# Patient Record
Sex: Female | Born: 1945 | Race: White | Hispanic: No | Marital: Married | Smoking: Never smoker
Health system: Southern US, Community
[De-identification: ages and names within clinical notes are randomized; demographics above are authoritative.]

## PROBLEM LIST (undated history)

## (undated) DIAGNOSIS — C50919 Malignant neoplasm of unspecified site of unspecified female breast: Secondary | ICD-10-CM

## (undated) DIAGNOSIS — E039 Hypothyroidism, unspecified: Secondary | ICD-10-CM

## (undated) DIAGNOSIS — I10 Essential (primary) hypertension: Secondary | ICD-10-CM

## (undated) HISTORY — PX: TUBAL LIGATION: SHX77

## (undated) HISTORY — DX: Malignant neoplasm of unspecified site of unspecified female breast: C50.919

## (undated) HISTORY — PX: COLONOSCOPY: SHX174

## (undated) HISTORY — PX: ABDOMINAL HYSTERECTOMY: SHX81

---

## 1991-09-11 HISTORY — PX: BREAST SURGERY: SHX581

## 1998-03-28 ENCOUNTER — Other Ambulatory Visit: Admission: RE | Admit: 1998-03-28 | Discharge: 1998-03-28 | Payer: Self-pay | Admitting: *Deleted

## 1998-08-11 ENCOUNTER — Encounter: Admission: RE | Admit: 1998-08-11 | Discharge: 1998-11-09 | Payer: Self-pay | Admitting: Oncology

## 1999-03-29 ENCOUNTER — Other Ambulatory Visit: Admission: RE | Admit: 1999-03-29 | Discharge: 1999-03-29 | Payer: Self-pay | Admitting: Obstetrics and Gynecology

## 1999-07-26 ENCOUNTER — Ambulatory Visit (HOSPITAL_COMMUNITY): Admission: RE | Admit: 1999-07-26 | Discharge: 1999-07-26 | Payer: Self-pay | Admitting: Gastroenterology

## 2000-05-01 ENCOUNTER — Other Ambulatory Visit: Admission: RE | Admit: 2000-05-01 | Discharge: 2000-05-01 | Payer: Self-pay | Admitting: *Deleted

## 2001-05-15 ENCOUNTER — Other Ambulatory Visit: Admission: RE | Admit: 2001-05-15 | Discharge: 2001-05-15 | Payer: Self-pay | Admitting: Obstetrics and Gynecology

## 2001-09-04 ENCOUNTER — Other Ambulatory Visit: Admission: RE | Admit: 2001-09-04 | Discharge: 2001-09-04 | Payer: Self-pay | Admitting: Obstetrics and Gynecology

## 2001-09-04 ENCOUNTER — Other Ambulatory Visit: Admission: RE | Admit: 2001-09-04 | Discharge: 2001-09-04 | Payer: Self-pay | Admitting: *Deleted

## 2001-09-12 ENCOUNTER — Encounter: Payer: Self-pay | Admitting: *Deleted

## 2001-09-12 ENCOUNTER — Encounter: Admission: RE | Admit: 2001-09-12 | Discharge: 2001-09-12 | Payer: Self-pay | Admitting: *Deleted

## 2001-11-11 ENCOUNTER — Other Ambulatory Visit: Admission: RE | Admit: 2001-11-11 | Discharge: 2001-11-11 | Payer: Self-pay | Admitting: Obstetrics and Gynecology

## 2002-05-18 ENCOUNTER — Other Ambulatory Visit: Admission: RE | Admit: 2002-05-18 | Discharge: 2002-05-18 | Payer: Self-pay | Admitting: Obstetrics and Gynecology

## 2002-07-27 ENCOUNTER — Ambulatory Visit (HOSPITAL_COMMUNITY): Admission: RE | Admit: 2002-07-27 | Discharge: 2002-07-27 | Payer: Self-pay | Admitting: Gastroenterology

## 2004-09-10 HISTORY — PX: DE QUERVAIN'S RELEASE: SHX1439

## 2005-07-04 ENCOUNTER — Ambulatory Visit (HOSPITAL_COMMUNITY): Admission: RE | Admit: 2005-07-04 | Discharge: 2005-07-04 | Payer: Self-pay | Admitting: Orthopedic Surgery

## 2005-07-04 ENCOUNTER — Encounter (INDEPENDENT_AMBULATORY_CARE_PROVIDER_SITE_OTHER): Payer: Self-pay | Admitting: Specialist

## 2005-07-04 ENCOUNTER — Ambulatory Visit (HOSPITAL_BASED_OUTPATIENT_CLINIC_OR_DEPARTMENT_OTHER): Admission: RE | Admit: 2005-07-04 | Discharge: 2005-07-04 | Payer: Self-pay | Admitting: Orthopedic Surgery

## 2006-03-08 ENCOUNTER — Emergency Department (HOSPITAL_COMMUNITY): Admission: EM | Admit: 2006-03-08 | Discharge: 2006-03-08 | Payer: Self-pay | Admitting: Emergency Medicine

## 2007-09-11 HISTORY — PX: DE QUERVAIN'S RELEASE: SHX1439

## 2008-05-18 ENCOUNTER — Ambulatory Visit (HOSPITAL_BASED_OUTPATIENT_CLINIC_OR_DEPARTMENT_OTHER): Admission: RE | Admit: 2008-05-18 | Discharge: 2008-05-18 | Payer: Self-pay | Admitting: Orthopedic Surgery

## 2009-06-29 ENCOUNTER — Encounter: Admission: RE | Admit: 2009-06-29 | Discharge: 2009-06-29 | Payer: Self-pay | Admitting: Gastroenterology

## 2011-01-22 ENCOUNTER — Other Ambulatory Visit: Payer: Self-pay | Admitting: Obstetrics and Gynecology

## 2011-01-23 NOTE — Op Note (Signed)
Donna Kirk, Donna Kirk             ACCOUNT NO.:  [id number]   MEDICAL RECORD NO.:  [id number]          PATIENT TYPE:  AMB   LOCATION:  DSC                          FACILITY:  [facility]   PHYSICIAN:  Cindee Salt, M.D.       DATE OF BIRTH:  07-25-1946   DATE OF PROCEDURE:  05/18/2008  DATE OF DISCHARGE:                               OPERATIVE REPORT   PREOPERATIVE DIAGNOSIS:  Suzette Battiest right wrist.   POSTOPERATIVE DIAGNOSIS:  Tommi Rumps Quervain's right wrist.   OPERATION:  Release first dorsal compartment extensor tendons right  wrist.   SURGEON:  Cindee Salt, MD   ASSISTANTCarolyne Fiscal, RN   ANESTHESIA:  General.   ANESTHESIOLOGIST:  Guadalupe Maple, MD   HISTORY:  The patient is a 65 year old female with a history of de  Quervain's tendinitis right wrist which had not responded to  conservative treatment.  She has elected to undergo decompression.  She  is aware of risks and complications including infection, recurrence  injury to arteries, nerves, tendons, incomplete relief of symptoms, and  dystrophy.  In the preoperative area, the patient is seen.  The  extremity marked by both the patient and surgeon.  Antibiotic given.  Questions again encouraged and answered.   PROCEDURE:  The patient was brought to the operating room where a  general anesthetic was carried out without difficulty under the  direction of Dr. Noreene Larsson.  She was prepped using DuraPrep, supine  position with right arm free.  A time-out was taken.  A longitudinal  incision was then made after exsanguination of the limb with an Esmarch  bandage and inflation of a tourniquet to 250 mmHg on the upper arm.  The  incision was carried down through subcutaneous tissue on the right wrist  over the first dorsal compartment.  The radial nerve was identified and  protected.  Retractors were gently placed.  The first dorsal compartment  was identified and incision was then made at the most dorsal aspect.  A  very significant  tenosynovitis was present.  This was debrided.  The EPB  was identified along with the APL tendon.  A septum was present.  This  was excised.  The wound was irrigated with saline.  The skin closed with  interrupted 4-0 Vicryl Rapide sutures.  A sterile compressive dressing  and thumb spica splint applied.  The patient tolerated the procedure  well and was taken to the recovery room for observation in satisfactory  condition.  She will be discharged home to return to the [facility]  [place] in 1 week on Vicodin.          ______________________________  Cindee Salt, M.D.    GK/MEDQ  D:  05/18/2008  T:  05/19/2008  Job:  [id number]   cc:   Tanya D. Daphine Deutscher, M.D.

## 2011-01-26 NOTE — Op Note (Signed)
NAME:  Donna Kirk, Donna Kirk                       ACCOUNT NO.:  [id number]   MEDICAL RECORD NO.:  [id number]                   PATIENT TYPE:  AMB   LOCATION:  ENDO                                 FACILITY:  [facility]   PHYSICIAN:  Anselmo Rod, M.D.               DATE OF BIRTH:  1945-11-30   DATE OF PROCEDURE:  07/27/2002  DATE OF DISCHARGE:                                 OPERATIVE REPORT   PROCEDURE PERFORMED:  Screening colonoscopy.   ENDOSCOPIST:  Charna Elizabeth, M.D.   INSTRUMENT USED:  Olympus pediatric adjustable colonoscope.   INDICATIONS FOR PROCEDURE:  The patient is a 65 year old white female with a  personal history of breast cancer and guaiac positive stools.  Rule out  colonic polyps, masses, hemorrhoids, etc.   PREPROCEDURE PREPARATION:  Informed consent was procured from the patient.  The patient was fasted for eight hours prior to the procedure and prepped  with a bottle of magnesium citrate and a gallon of NuLytely the night prior  to the procedure.   PREPROCEDURE PHYSICAL:  The patient had stable vital signs. Neck supple.  Chest clear to auscultation.  S1 and S2 regular.  Abdomen soft with normal  bowel sounds.   DESCRIPTION OF PROCEDURE:  The patient was placed in left lateral decubitus  position and sedated with 100 mg of Demerol and 5 mg of Versed  intravenously.  Once the patient was adequately sedated and maintained on  low flow oxygen and continuous cardiac monitoring, the Olympus video  colonoscope was advanced in the rectum to the cecum with difficulty.  There  was a large amount of residual stool in the colon and isolated diverticula  were seen in the left colon.  Small internal hemorrhoids were seen on  retroflexion.  The rest of the colonic mucosa up to the terminal ileum  appeared normal and without lesions.  Small lesions could have been missed  secondary to poor prep.   IMPRESSION:  1. Small nonbleeding internal hemorrhoids.  2. Isolated  diverticula in the left colon.  3. Large amount of residual stool in the colon.  Small lesions could have     been missed.    RECOMMENDATIONS:  1. Repeat guaiac testing will be done on an outpatient basis.  2. Repeat colorectal cancer screening is recommended in the next five years     considering the patient's history.  3. Outpatient follow-up in the next two weeks.                                                   Anselmo Rod, M.D.    JNM/MEDQ  D:  07/28/2002  T:  07/28/2002  Job:  [id number]   cc:   Sung Amabile. Roslyn Smiling, M.D.  [address] [employer]  [address]  [place]  [place] [address]  Fax: [phone]   Cephas Darby. Daphine Deutscher, M.D.  [phone] [address], [address]  [place]  [place] [address]  Fax: [phone]

## 2011-01-26 NOTE — Op Note (Signed)
NAMEKATERINE, MORUA             ACCOUNT NO.:  [id number]   MEDICAL RECORD NO.:  [id number]          PATIENT TYPE:  AMB   LOCATION:  DSC                          FACILITY:  [facility]   PHYSICIAN:  Cindee Salt, M.D.       DATE OF BIRTH:  07/24/1946   DATE OF PROCEDURE:  07/04/2005  DATE OF DISCHARGE:                                 OPERATIVE REPORT   PREOPERATIVE DIAGNOSIS:  Suzette Battiest, left wrist, with cyst.   POSTOPERATIVE DIAGNOSIS:  Suzette Battiest, left wrist, with cyst.   OPERATION:  Excision of cyst and release of first dorsal compartment of left  wrist.   SURGEON:  Cindee Salt, M.D.   ASSISTANT:  Alfredo Bach R.N.   ANESTHESIA:  General.   HISTORY:  The patient is a 65 year old female with a history of De  Quervain's; this has not responded to conservative treatment.  She has  developed a cyst with pain.  The cyst is at the distal aspect of the margin  of the first dorsal compartment.  She is desirous of release, being aware of  risks and complications including injury to the radial nerve and dystrophy.  Preoperatively, the patient was marked by both surgeon and patient for the  operation.   PROCEDURE:  The patient was brought to the operating room.  A general  anesthetic was carried out without difficulty.  She was prepped using  DuraPrep, supine position, left arm free.  The limb was exsanguinated with  an Esmarch bandage and tourniquet placed on the forearm was inflated to 250  mmHg.  A longitudinal incision was made directly over the first dorsal  compartment and carried down to the subcutaneous tissue with blunt and sharp  dissection.  The radial nerve was identified and protected.  Retractors were  gently placed.  The first dorsal compartment cyst was immediately apparent.  The first dorsal compartment was incised along its most dorsal aspect.  The  extensor pollicis brevis was identified; there was not a septum.  The cyst  was excised and sent to Pathology.  A loose  body was removed prior to  release of the first dorsal compartment; this was not sent to Pathology.  The entire compartment was released.  APL and EPB were both confirmed, the  wound irrigated.  The skin was then closed with a subcuticular 4-0 Monocryl  sutures.  Steri-Strips were applied and sterile compressive dressing and  radial splint applied.  The patient tolerated the procedure well and was  taken to the recovery room for observation in satisfactory condition.   She is discharged home to return to the [facility] [place] in 1 week  on Vicodin.           ______________________________  Cindee Salt, M.D.     GK/MEDQ  D:  07/04/2005  T:  07/04/2005  Job:  [id number]

## 2011-01-26 NOTE — Op Note (Signed)
   NAME:  Donna Kirk, Donna Kirk                       ACCOUNT NO.:  [id number]   MEDICAL RECORD NO.:  [id number]                   PATIENT TYPE:  AMB   LOCATION:  ENDO                                 FACILITY:  [facility]   PHYSICIAN:  Anselmo Rod, M.D.               DATE OF BIRTH:  [birth year]   DATE OF PROCEDURE:  07/27/2002  DATE OF DISCHARGE:                                 OPERATIVE REPORT   PROCEDURE:  Screening colonoscopy.   ENDOSCOPIST:  Anselmo Rod, M.D.   INSTRUMENT USED:  Pediatric adjustable Olympus colonoscope.   INDICATION FOR PROCEDURE:  Guaiac-positive stool in a 65 year old white  female with a personal history of breast cancer.  Rule out colonic polyps,  masses, hemorrhoids, etc.   PREPROCEDURE PREPARATION:  Dictation ends at this point.                                                Anselmo Rod, M.D.    JNM/MEDQ  D:  07/27/2002  T:  07/28/2002  Job:  [id number]   cc:   Tanya D. Daphine Deutscher, M.D.  [phone]  [place]  [place] [address]  Fax: [phone]   Sung Amabile. Roslyn Smiling, M.D.  [address]  [address]  [place]  [place] [address]  Fax: [phone]

## 2011-01-26 NOTE — Procedures (Signed)
[facility]  Patient:    Donna Kirk                     MRN: [id number] Proc. Date: 07/26/99 Adm. Date:  [id number] Attending:  Charna Elizabeth CC:         Sung Amabile. Roslyn Smiling, M.D.             Hayes Ludwig, [facility]                           Procedure Report  DATE OF BIRTH:  05/26/1946  REFERRING PHYSICIAN:  Sung Amabile. Roslyn Smiling, M.D.  PROCEDURE PERFORMED:  Colonoscopy with snare polypectomy.  ENDOSCOPIST:  Anselmo Rod, M.D.  INSTRUMENT USED:  Olympus video colonoscope.  INDICATIONS:  ______ in a 66 year old white female with a personal and family history of breast cancer.  Patient has had a right modified radical mastectomy n the past.  Rule out polyps, AVMs, masses, hemorrhoids, etc.  PREPROCEDURE PREPARATION:  Informed consent was procured from the patient.  The  patient was fasted for 8 hours prior to the procedure and prepped with a bottle of magnesium citrate and a gallon of NuLytely the night prior to the procedure.  PREPROCEDURE PHYSICAL:  Patient has stable vital signs.  NECK:  Supple.  CHEST:  Clear to auscultation. S1, S2 regular.  ABDOMEN:  Soft with normal abdominal bowel sounds.  DESCRIPTION OF PROCEDURE:  The patient was placed in left lateral decubitus position and sedated with 90 mg of Demerol and 8 mg of Versed intravenously. Once the patient is adequately sedated and maintained on low-flow oxygen and continuous cardiac monitoring, the Olympus video colonoscope is advanced from the rectum to the cecum with slight difficulty secondary to a very tortuous colon. Few left-sided diverticular pockets were seen.  There was a small sessile polyp snared from 90 cm by snare polypectomy forceps.  The cecum and the right colon appeared normal. There was small internal and external hemorrhoids seen on retroflexion and anal  inspection respectively.  The patient tolerated the procedure well  without complication.  IMPRESSION: 1. Small nonbleeding internal and external hemorrhoids. 2. Few early left-sided diverticular pockets. 3. A small sessile polyp snared from 90 cm. 4. Normal appearing right colon and cecum.  RECOMMENDATIONS: 1. Await pathology results. 2. Outpatient follow-up in the next 10 days. 3. Avoid all nonsteroidals for the next 7-10 days. DD:  07/26/99 TD:  07/26/99 Job: [id number]

## 2011-06-13 LAB — POCT HEMOGLOBIN-HEMACUE: Hemoglobin: 15.3 — ABNORMAL HIGH

## 2012-06-26 ENCOUNTER — Encounter (HOSPITAL_BASED_OUTPATIENT_CLINIC_OR_DEPARTMENT_OTHER): Payer: Self-pay | Admitting: *Deleted

## 2012-06-26 NOTE — Progress Notes (Signed)
No cardiac or resp problems No labs needed 

## 2012-07-02 ENCOUNTER — Other Ambulatory Visit: Payer: Self-pay | Admitting: Plastic Surgery

## 2012-07-02 DIAGNOSIS — N6489 Other specified disorders of breast: Secondary | ICD-10-CM

## 2012-07-02 NOTE — H&P (Signed)
This document contains confidential information from a [facility] medical record system and may be unauthenticated. Release may be made only with a valid authorization or in accordance with applicable policies of Medical Center or its affiliates. This document must be maintained in a secure manner or discarded/destroyed as required by Medical Center policy or by a confidential means such as shredding.   Donna Kirk   06/17/2012 10:00 AM Office Visit  MRN: [id number]   Department: [facility]  Dept Phone: [phone]   Description: Female DOB: May 12, 1946  Provider: Wayland Denis, DO   Diagnoses  -  Acquired absence of breast and absent nipple   - Primary    V45.71     Reason for Visit  -  Breast Reconstruction    Vitals - Last Recorded      154/76  69  1.6 m (5' 2.99")  86.183 kg (190 lb)  33.67 kg/m2       Subjective:    Patient ID: Donna Kirk is a 66 y.o. female.  HPI The patient is a 66 yrs old wf here for history and physical of the excess tissue on the right axillary area. She underwent a right mastectomy in 1993 for breast cancer followed by chemotherapy but no radiation. She is not interested in breast reconstruction on the right. She is very uncomfortable with the excess skin laterally and finds it difficult to fit into clothes and her bra. She also has a significant difference in the size of the breasts with a 40 D cup on the left. She is otherwise in good health and not had any recent abnormal mammagrams.  The following portions of the patient's history were reviewed and updated as appropriate: allergies, current medications, past family history, past medical history, past social history, past surgical history and problem list.  Review of Systems  Constitutional: Negative.   HENT: Negative.   Eyes: Negative.   Respiratory: Negative.   Cardiovascular: Negative.   Gastrointestinal: Negative.   Genitourinary: Negative.   Musculoskeletal:  Negative.   Hematological: Negative.   Psychiatric/Behavioral: Negative.       Objective:    Physical Exam  Constitutional: She appears well-developed and well-nourished.  HENT:   Head: Normocephalic and atraumatic.  Right Ear: External ear normal.  Left Ear: External ear normal.  Eyes: Conjunctivae normal and EOM are normal. Pupils are equal, round, and reactive to light.  Neck: Normal range of motion.  Cardiovascular: Normal rate.   Pulmonary/Chest: Effort normal.  Abdominal: Soft.  Neurological: She is alert.  Skin: Skin is warm.  Psychiatric: She has a normal mood and affect. Her behavior is normal. Judgment and thought content normal.     Assessment:   1.  Acquired absence of breast and absent nipple       Plan:     Left breast mastopexy/reduction and right breast excision of excess lateral tissue. Risks and complications were discussed and include bleeding, pain, scar and risk of anesthesia.    Medications Ordered This Encounter     HYDROcodone-acetaminophen (NORCO) 5-325 mg per tablet  Take 1 tablet by mouth every 6 (six) hours as needed for 10 days for Pain.    docusate sodium (COLACE) 100 MG capsule  Take 1 capsule (100 mg total) by mouth 3 times daily.    promethazine (PHENERGAN) 12.5 MG tablet Take 2 tablets (25 mg total) by mouth every 6 (six) hours as needed for 7 days for Nausea.     cephalexin (KEFLEX) 500  MG capsule Take 1 capsule (500 mg total) by mouth 4 times daily.

## 2012-07-03 ENCOUNTER — Encounter (HOSPITAL_BASED_OUTPATIENT_CLINIC_OR_DEPARTMENT_OTHER): Payer: Self-pay | Admitting: Anesthesiology

## 2012-07-03 ENCOUNTER — Ambulatory Visit (HOSPITAL_BASED_OUTPATIENT_CLINIC_OR_DEPARTMENT_OTHER): Payer: Medicare Other | Admitting: Anesthesiology

## 2012-07-03 ENCOUNTER — Encounter (HOSPITAL_BASED_OUTPATIENT_CLINIC_OR_DEPARTMENT_OTHER): Payer: Self-pay

## 2012-07-03 ENCOUNTER — Encounter (HOSPITAL_BASED_OUTPATIENT_CLINIC_OR_DEPARTMENT_OTHER)
Admission: RE | Disposition: A | Discharge status: Home / Self Care | Payer: Self-pay | Source: Ambulatory Visit | Attending: Plastic Surgery

## 2012-07-03 ENCOUNTER — Ambulatory Visit (HOSPITAL_BASED_OUTPATIENT_CLINIC_OR_DEPARTMENT_OTHER)
Admission: RE | Admit: 2012-07-03 | Discharge: 2012-07-04 | Disposition: A | Discharge status: Home / Self Care | Payer: Medicare Other | Source: Ambulatory Visit | Attending: Plastic Surgery | Admitting: Plastic Surgery

## 2012-07-03 DIAGNOSIS — D249 Benign neoplasm of unspecified breast: Secondary | ICD-10-CM | POA: Insufficient documentation

## 2012-07-03 DIAGNOSIS — L909 Atrophic disorder of skin, unspecified: Secondary | ICD-10-CM | POA: Insufficient documentation

## 2012-07-03 DIAGNOSIS — Z9010 Acquired absence of unspecified breast and nipple: Secondary | ICD-10-CM | POA: Insufficient documentation

## 2012-07-03 DIAGNOSIS — N6089 Other benign mammary dysplasias of unspecified breast: Secondary | ICD-10-CM | POA: Insufficient documentation

## 2012-07-03 DIAGNOSIS — Z853 Personal history of malignant neoplasm of breast: Secondary | ICD-10-CM | POA: Insufficient documentation

## 2012-07-03 DIAGNOSIS — L919 Hypertrophic disorder of the skin, unspecified: Secondary | ICD-10-CM | POA: Insufficient documentation

## 2012-07-03 DIAGNOSIS — N6489 Other specified disorders of breast: Secondary | ICD-10-CM | POA: Diagnosis present

## 2012-07-03 HISTORY — PX: BREAST CYST EXCISION: SHX579

## 2012-07-03 HISTORY — PX: MASTOPEXY: SHX5358

## 2012-07-03 HISTORY — DX: Hypothyroidism, unspecified: E03.9

## 2012-07-03 SURGERY — MASTOPEXY
Anesthesia: General | Site: Breast | Laterality: Left | Wound class: Clean

## 2012-07-03 MED ORDER — ONDANSETRON HCL 4 MG/2ML IJ SOLN: 4.0000 mg | Freq: Four times a day (QID) | INTRAMUSCULAR | Status: DC | PRN

## 2012-07-03 MED ORDER — LIDOCAINE HCL (CARDIAC) 20 MG/ML IV SOLN
INTRAVENOUS | Status: DC | PRN
  Administered 2012-07-03: 50 mg via INTRAVENOUS

## 2012-07-03 MED ORDER — KCL IN DEXTROSE-NACL 20-5-0.45 MEQ/L-%-% IV SOLN
INTRAVENOUS | Status: DC
  Administered 2012-07-03: 18:00:00 via INTRAVENOUS

## 2012-07-03 MED ORDER — LACTATED RINGERS IV SOLN
INTRAVENOUS | Status: DC | PRN
  Administered 2012-07-03: 55 mL

## 2012-07-03 MED ORDER — GLYCOPYRROLATE 0.2 MG/ML IJ SOLN
INTRAMUSCULAR | Status: DC | PRN
  Administered 2012-07-03: .4 mg via INTRAVENOUS

## 2012-07-03 MED ORDER — HYDROCODONE-ACETAMINOPHEN 5-325 MG PO TABS: 1.0000 | ORAL_TABLET | ORAL | Status: DC | PRN

## 2012-07-03 MED ORDER — NEOSTIGMINE METHYLSULFATE 1 MG/ML IJ SOLN
INTRAMUSCULAR | Status: DC | PRN
  Administered 2012-07-03: 3 mg via INTRAVENOUS

## 2012-07-03 MED ORDER — MORPHINE SULFATE 2 MG/ML IJ SOLN: 2.0000 mg | INTRAMUSCULAR | Status: DC | PRN

## 2012-07-03 MED ORDER — ACETAMINOPHEN 650 MG RE SUPP: 650.0000 mg | Freq: Four times a day (QID) | RECTAL | Status: DC | PRN

## 2012-07-03 MED ORDER — HYDROMORPHONE HCL PF 1 MG/ML IJ SOLN
0.2500 mg | INTRAMUSCULAR | Status: DC | PRN
  Administered 2012-07-03 (×3): 0.5 mg via INTRAVENOUS

## 2012-07-03 MED ORDER — LACTATED RINGERS IV SOLN
INTRAVENOUS | Status: DC
  Administered 2012-07-03 (×2): via INTRAVENOUS

## 2012-07-03 MED ORDER — FENTANYL CITRATE 0.05 MG/ML IJ SOLN
INTRAMUSCULAR | Status: DC | PRN
  Administered 2012-07-03 (×2): 50 ug via INTRAVENOUS

## 2012-07-03 MED ORDER — ONDANSETRON HCL 4 MG/2ML IJ SOLN
INTRAMUSCULAR | Status: DC | PRN
  Administered 2012-07-03: 4 mg via INTRAVENOUS

## 2012-07-03 MED ORDER — SODIUM CHLORIDE 0.9 % IR SOLN
Status: DC | PRN
  Administered 2012-07-03: 13:00:00

## 2012-07-03 MED ORDER — SODIUM CHLORIDE 0.9 % IV SOLN
3.0000 g | Freq: Four times a day (QID) | INTRAVENOUS | Status: DC
  Administered 2012-07-03 – 2012-07-04 (×3): 3 g via INTRAVENOUS

## 2012-07-03 MED ORDER — ACETAMINOPHEN 325 MG PO TABS: 650.0000 mg | ORAL_TABLET | Freq: Four times a day (QID) | ORAL | Status: DC | PRN

## 2012-07-03 MED ORDER — ROCURONIUM BROMIDE 100 MG/10ML IV SOLN
INTRAVENOUS | Status: DC | PRN
  Administered 2012-07-03: 50 mg via INTRAVENOUS

## 2012-07-03 MED ORDER — EPHEDRINE SULFATE 50 MG/ML IJ SOLN
INTRAMUSCULAR | Status: DC | PRN
  Administered 2012-07-03 (×2): 10 mg via INTRAVENOUS

## 2012-07-03 MED ORDER — ACETAMINOPHEN 10 MG/ML IV SOLN: 1000.0000 mg | Freq: Once | INTRAVENOUS | Status: DC | PRN

## 2012-07-03 MED ORDER — PROPOFOL 10 MG/ML IV BOLUS
INTRAVENOUS | Status: DC | PRN
  Administered 2012-07-03: 200 mg via INTRAVENOUS

## 2012-07-03 MED ORDER — ONDANSETRON HCL 4 MG/2ML IJ SOLN: 4.0000 mg | Freq: Once | INTRAMUSCULAR | Status: DC | PRN

## 2012-07-03 MED ORDER — MIDAZOLAM HCL 5 MG/5ML IJ SOLN
INTRAMUSCULAR | Status: DC | PRN
  Administered 2012-07-03: 2 mg via INTRAVENOUS

## 2012-07-03 MED ORDER — DOCUSATE SODIUM 100 MG PO CAPS
100.0000 mg | ORAL_CAPSULE | Freq: Three times a day (TID) | ORAL | Status: DC
  Administered 2012-07-03: 100 mg via ORAL

## 2012-07-03 MED ORDER — CEFAZOLIN SODIUM-DEXTROSE 2-3 GM-% IV SOLR
2.0000 g | INTRAVENOUS | Status: AC
  Administered 2012-07-03: 2 g via INTRAVENOUS

## 2012-07-03 SURGICAL SUPPLY — 69 items
ADH SKN CLS APL DERMABOND .7 (GAUZE/BANDAGES/DRESSINGS) ×3
BAG DECANTER FOR FLEXI CONT (MISCELLANEOUS) ×4 IMPLANT
BANDAGE GAUZE ELAST BULKY 4 IN (GAUZE/BANDAGES/DRESSINGS) ×8 IMPLANT
BINDER BREAST LRG (GAUZE/BANDAGES/DRESSINGS) IMPLANT
BINDER BREAST MEDIUM (GAUZE/BANDAGES/DRESSINGS) IMPLANT
BINDER BREAST XLRG (GAUZE/BANDAGES/DRESSINGS) IMPLANT
BINDER BREAST XXLRG (GAUZE/BANDAGES/DRESSINGS) IMPLANT
BIOPATCH BLUE 3/4IN DISK W/1.5 (GAUZE/BANDAGES/DRESSINGS) IMPLANT
BLADE HEX COATED 2.75 (ELECTRODE) ×4 IMPLANT
BLADE SURG 10 STRL SS (BLADE) ×8 IMPLANT
BLADE SURG 15 STRL LF DISP TIS (BLADE) ×6 IMPLANT
BLADE SURG 15 STRL SS (BLADE) ×8
CANISTER LINER 1300 C W/ELBOW (MISCELLANEOUS) ×4 IMPLANT
CANISTER SUCTION 1200CC (MISCELLANEOUS) ×4 IMPLANT
CHLORAPREP W/TINT 26ML (MISCELLANEOUS) ×4 IMPLANT
CLOTH BEACON ORANGE TIMEOUT ST (SAFETY) ×4 IMPLANT
COVER MAYO STAND STRL (DRAPES) ×4 IMPLANT
COVER TABLE BACK 60X90 (DRAPES) ×4 IMPLANT
DECANTER SPIKE VIAL GLASS SM (MISCELLANEOUS) IMPLANT
DERMABOND ADVANCED (GAUZE/BANDAGES/DRESSINGS) ×1
DERMABOND ADVANCED .7 DNX12 (GAUZE/BANDAGES/DRESSINGS) ×5 IMPLANT
DRAIN CHANNEL 19F RND (DRAIN) ×2 IMPLANT
DRAPE LAPAROSCOPIC ABDOMINAL (DRAPES) ×4 IMPLANT
DRSG PAD ABDOMINAL 8X10 ST (GAUZE/BANDAGES/DRESSINGS) ×8 IMPLANT
DRSG TEGADERM 2-3/8X2-3/4 SM (GAUZE/BANDAGES/DRESSINGS) IMPLANT
ELECT BLADE 4.0 EZ CLEAN MEGAD (MISCELLANEOUS) ×4
ELECT BLADE 6.5 .24CM SHAFT (ELECTRODE) IMPLANT
ELECT REM PT RETURN 9FT ADLT (ELECTROSURGICAL) ×4
ELECTRODE BLDE 4.0 EZ CLN MEGD (MISCELLANEOUS) ×3 IMPLANT
ELECTRODE REM PT RTRN 9FT ADLT (ELECTROSURGICAL) ×3 IMPLANT
EVACUATOR SILICONE 100CC (DRAIN) ×2 IMPLANT
FILTER LIPOSUCTION (MISCELLANEOUS) ×4 IMPLANT
GAUZE SPONGE 4X4 12PLY STRL LF (GAUZE/BANDAGES/DRESSINGS) IMPLANT
GLOVE BIO SURGEON STRL SZ 6.5 (GLOVE) ×10 IMPLANT
GLOVE SKINSENSE NS SZ7.0 (GLOVE) ×1
GLOVE SKINSENSE STRL SZ7.0 (GLOVE) ×1 IMPLANT
GOWN PREVENTION PLUS XLARGE (GOWN DISPOSABLE) ×8 IMPLANT
NDL HYPO 25X1 1.5 SAFETY (NEEDLE) ×2 IMPLANT
NDL SPNL 18GX3.5 QUINCKE PK (NEEDLE) ×2 IMPLANT
NDL SPNL 22GX3.5 QUINCKE BK (NEEDLE) IMPLANT
NEEDLE HYPO 25X1 1.5 SAFETY (NEEDLE) ×4 IMPLANT
NEEDLE SPNL 18GX3.5 QUINCKE PK (NEEDLE) ×4 IMPLANT
NEEDLE SPNL 22GX3.5 QUINCKE BK (NEEDLE) IMPLANT
NS IRRIG 1000ML POUR BTL (IV SOLUTION) ×4 IMPLANT
PACK BASIN DAY SURGERY FS (CUSTOM PROCEDURE TRAY) ×4 IMPLANT
PENCIL BUTTON HOLSTER BLD 10FT (ELECTRODE) ×4 IMPLANT
PIN SAFETY STERILE (MISCELLANEOUS) IMPLANT
SLEEVE SCD COMPRESS KNEE MED (MISCELLANEOUS) ×4 IMPLANT
SPONGE GAUZE 4X4 12PLY (GAUZE/BANDAGES/DRESSINGS) IMPLANT
SPONGE LAP 18X18 X RAY DECT (DISPOSABLE) ×10 IMPLANT
STRIP CLOSURE SKIN 1/2X4 (GAUZE/BANDAGES/DRESSINGS) ×4 IMPLANT
SUT MNCRL AB 4-0 PS2 18 (SUTURE) IMPLANT
SUT MON AB 5-0 PS2 18 (SUTURE) ×10 IMPLANT
SUT PDS AB 2-0 CT2 27 (SUTURE) IMPLANT
SUT SILK 3 0 PS 1 (SUTURE) IMPLANT
SUT VIC AB 3-0 SH 27 (SUTURE) ×8
SUT VIC AB 3-0 SH 27X BRD (SUTURE) ×6 IMPLANT
SUT VIC AB 5-0 PS2 18 (SUTURE) ×2 IMPLANT
SUT VICRYL 4-0 PS2 18IN ABS (SUTURE) ×8 IMPLANT
SYR 20CC LL (SYRINGE) ×8 IMPLANT
SYR 50ML LL SCALE MARK (SYRINGE) IMPLANT
SYR BULB IRRIGATION 50ML (SYRINGE) ×4 IMPLANT
SYR CONTROL 10ML LL (SYRINGE) ×4 IMPLANT
TOWEL OR 17X24 6PK STRL BLUE (TOWEL DISPOSABLE) ×8 IMPLANT
TUBE CONNECTING 20X1/4 (TUBING) ×4 IMPLANT
TUBING SET GRADUATE ASPIR 12FT (MISCELLANEOUS) ×4 IMPLANT
UNDERPAD 30X30 INCONTINENT (UNDERPADS AND DIAPERS) ×8 IMPLANT
WATER STERILE IRR 1000ML POUR (IV SOLUTION) IMPLANT
YANKAUER SUCT BULB TIP NO VENT (SUCTIONS) ×4 IMPLANT

## 2012-07-03 NOTE — Anesthesia Preprocedure Evaluation (Signed)
Anesthesia Evaluation  Patient identified by MRN, date of birth, ID band Patient awake    Reviewed: Allergy & Precautions, NPO status   Airway Mallampati: II      Dental  (+) Dental Advisory Given   Pulmonary  breath sounds clear to auscultation        Cardiovascular Rhythm:Regular Rate:Normal     Neuro/Psych    GI/Hepatic   Endo/Other    Renal/GU      Musculoskeletal   Abdominal   Peds  Hematology   Anesthesia Other Findings   Reproductive/Obstetrics                           Anesthesia Physical Anesthesia Plan  ASA: II  Anesthesia Plan: General   Post-op Pain Management:    Induction: Intravenous  Airway Management Planned: Oral ETT  Additional Equipment:   Intra-op Plan:   Post-operative Plan: Extubation in OR  Informed Consent: I have reviewed the patients History and Physical, chart, labs and discussed the procedure including the risks, benefits and alternatives for the proposed anesthesia with the patient or authorized representative who has indicated his/her understanding and acceptance.   Dental advisory given  Plan Discussed with: CRNA and Surgeon  Anesthesia Plan Comments: (H/O R. Mastectomy 1993 with excess tissue R. Axilla and difference in breast size Hypothyroidism  Plan GA with ETT  Kipp Brood, MD )        Anesthesia Quick Evaluation

## 2012-07-03 NOTE — Transfer of Care (Signed)
Immediate Anesthesia Transfer of Care Note  Patient: Donna Kirk  Procedure(s) Performed: Procedure(s) (LRB) with comments: MASTOPEXY (Left) - left breast mastopexy reduction, left lateral breast excision with liposuction for symmetry CYST EXCISION BREAST () - right breast excision of excess lateral tissue  Patient Location: PACU  Anesthesia Type: General  Level of Consciousness: awake  Airway & Oxygen Therapy: Patient Spontanous Breathing and Patient connected to face mask oxygen  Post-op Assessment: Report given to PACU RN and Post -op Vital signs reviewed and stable  Post vital signs: Reviewed and stable  Complications: No apparent anesthesia complications

## 2012-07-03 NOTE — Brief Op Note (Signed)
07/03/2012  2:36 PM  PATIENT:  Donna Kirk  66 y.o. female  PRE-OPERATIVE DIAGNOSIS:  breast cancer  POST-OPERATIVE DIAGNOSIS:  breast cancer  PROCEDURE:  Procedure(s) (LRB) with comments: MASTOPEXY (Left) - left breast mastopexy reduction, left lateral breast excision with liposuction for symmetry CYST EXCISION BREAST () - right breast excision of excess lateral tissue  SURGEON:  Surgeon(s) and Role:    * Claire Sanger, DO - Primary  PHYSICIAN ASSISTANT: Shawn Rayburn, PA  ASSISTANTS: none   ANESTHESIA:   general  EBL:  Total I/O In: 1000 [I.V.:1000] Out: -   BLOOD ADMINISTERED:none  DRAINS: (1) Jackson-Pratt drain(s) with closed bulb suction in the left breast pocket   LOCAL MEDICATIONS USED:  MARCAINE     SPECIMEN:  Source of Specimen:  right breast and left lateral breast  DISPOSITION OF SPECIMEN:  PATHOLOGY  COUNTS:  YES  TOURNIQUET:  * No tourniquets in log *  DICTATION: dictated  PLAN OF CARE: Discharge to home after PACU  PATIENT DISPOSITION:  PACU - hemodynamically stable.   Delay start of Pharmacological VTE agent (>24hrs) due to surgical blood loss or risk of bleeding: no

## 2012-07-03 NOTE — Anesthesia Procedure Notes (Signed)
Procedure Name: Intubation Date/Time: 07/03/2012 12:21 PM Performed by: Zenia Resides D Pre-anesthesia Checklist: Patient identified, Emergency Drugs available, Suction available and Patient being monitored Patient Re-evaluated:Patient Re-evaluated prior to inductionOxygen Delivery Method: Circle System Utilized Preoxygenation: Pre-oxygenation with 100% oxygen Intubation Type: IV induction Ventilation: Mask ventilation without difficulty Laryngoscope Size: Miller and 2 Grade View: Grade I Tube type: Oral Tube size: 7.0 mm Number of attempts: 2 Airway Equipment and Method: stylet and oral airway Placement Confirmation: ETT inserted through vocal cords under direct vision,  positive ETCO2 and breath sounds checked- equal and bilateral Secured at: 20 cm Tube secured with: Tape Dental Injury: Teeth and Oropharynx as per pre-operative assessment and Injury to lip

## 2012-07-03 NOTE — Interval H&P Note (Signed)
History and Physical Interval Note:  07/03/2012 11:41 AM  Neena Rhymes  has presented today for surgery, with the diagnosis of breast cancer  The various methods of treatment have been discussed with the patient and family. After consideration of risks, benefits and other options for treatment, the patient has consented to  Procedure(s) (LRB) with comments: MASTOPEXY (Left) - left breast mastopexy reduction, left lateral breast excision with liposuction for symmetry BREAST REDUCTION WITH LIPOSUCTION (Left) as a surgical intervention .  The patient's history has been reviewed, patient examined, no change in status, stable for surgery.  I have reviewed the patient's chart and labs.  Questions were answered to the patient's satisfaction.     SANGER,CLAIRE

## 2012-07-03 NOTE — H&P (View-Only) (Signed)
This document contains confidential information from a [facility] medical record system and may be unauthenticated. Release may be made only with a valid authorization or in accordance with applicable policies of Medical Center or its affiliates. This document must be maintained in a secure manner or discarded/destroyed as required by Medical Center policy or by a confidential means such as shredding.   Donna Kirk   06/17/2012 10:00 AM Office Visit  MRN: [id number]   Department: [facility]  Dept Phone: [phone]   Description: Female DOB: 02/05/1946  Provider: Claire Sanger, DO   Diagnoses  -  Acquired absence of breast and absent nipple   - Primary    V45.71     Reason for Visit  -  Breast Reconstruction    Vitals - Last Recorded      154/76  69  1.6 m (5' 2.99")  86.183 kg (190 lb)  33.67 kg/m2       Subjective:    Patient ID: Donna Kirk is a 66 y.o. female.  HPI The patient is a 66 yrs old wf here for history and physical of the excess tissue on the right axillary area. She underwent a right mastectomy in 1993 for breast cancer followed by chemotherapy but no radiation. She is not interested in breast reconstruction on the right. She is very uncomfortable with the excess skin laterally and finds it difficult to fit into clothes and her bra. She also has a significant difference in the size of the breasts with a 40 D cup on the left. She is otherwise in good health and not had any recent abnormal mammagrams.  The following portions of the patient's history were reviewed and updated as appropriate: allergies, current medications, past family history, past medical history, past social history, past surgical history and problem list.  Review of Systems  Constitutional: Negative.   HENT: Negative.   Eyes: Negative.   Respiratory: Negative.   Cardiovascular: Negative.   Gastrointestinal: Negative.   Genitourinary: Negative.   Musculoskeletal:  Negative.   Hematological: Negative.   Psychiatric/Behavioral: Negative.       Objective:    Physical Exam  Constitutional: She appears well-developed and well-nourished.  HENT:   Head: Normocephalic and atraumatic.  Right Ear: External ear normal.  Left Ear: External ear normal.  Eyes: Conjunctivae normal and EOM are normal. Pupils are equal, round, and reactive to light.  Neck: Normal range of motion.  Cardiovascular: Normal rate.   Pulmonary/Chest: Effort normal.  Abdominal: Soft.  Neurological: She is alert.  Skin: Skin is warm.  Psychiatric: She has a normal mood and affect. Her behavior is normal. Judgment and thought content normal.     Assessment:   1.  Acquired absence of breast and absent nipple       Plan:     Left breast mastopexy/reduction and right breast excision of excess lateral tissue. Risks and complications were discussed and include bleeding, pain, scar and risk of anesthesia.    Medications Ordered This Encounter     HYDROcodone-acetaminophen (NORCO) 5-325 mg per tablet  Take 1 tablet by mouth every 6 (six) hours as needed for 10 days for Pain.    docusate sodium (COLACE) 100 MG capsule  Take 1 capsule (100 mg total) by mouth 3 times daily.    promethazine (PHENERGAN) 12.5 MG tablet Take 2 tablets (25 mg total) by mouth every 6 (six) hours as needed for 7 days for Nausea.     cephalexin (KEFLEX) 500   MG capsule Take 1 capsule (500 mg total) by mouth 4 times daily.   

## 2012-07-03 NOTE — Progress Notes (Signed)
Dr. Kelly Splinter notified of Dr. Morley Kos concern re:  pt's continued sleepiness and inability to maintain O2 sats above 91% on room air.  She will enter orders for overnight stay in [facility].

## 2012-07-03 NOTE — Progress Notes (Signed)
Husband will go home and return in AM to take pt. home

## 2012-07-03 NOTE — Progress Notes (Signed)
Attempted patient on room air but sats dropped to mid 80's and placed on Face mask at 6l  Resting soundly at present time.

## 2012-07-04 ENCOUNTER — Encounter (HOSPITAL_BASED_OUTPATIENT_CLINIC_OR_DEPARTMENT_OTHER): Payer: Self-pay | Admitting: Plastic Surgery

## 2012-07-04 NOTE — Anesthesia Postprocedure Evaluation (Signed)
  Anesthesia Post-op Note  Patient: Donna Kirk  Procedure(s) Performed: Procedure(s) (LRB) with comments: MASTOPEXY (Left) - left breast mastopexy reduction, left lateral breast excision with liposuction for symmetry CYST EXCISION BREAST () - right breast excision of excess lateral tissue  Patient Location: PACU  Anesthesia Type: General  Level of Consciousness: awake, alert  and oriented  Airway and Oxygen Therapy: Patient Spontanous Breathing  Post-op Pain: none  Post-op Assessment: Post-op Vital signs reviewed and Patient's Cardiovascular Status Stable  Post-op Vital Signs: stable  Complications: None

## 2012-07-04 NOTE — Op Note (Signed)
NAMELOUISA, [name] NO.:  [id number]  MEDICAL RECORD NO.:  [id number]  LOCATION:[facility]     FACILITY:[facility]  PHYSICIAN:  Wayland Denis, DO      DATE OF BIRTH:  19-Mar-1946  DATE OF PROCEDURE:  07/03/2012 DATE OF DISCHARGE:                              OPERATIVE REPORT   PREOPERATIVE DIAGNOSIS:  Breast asymmetry secondary to postsurgical.  POSTOPERATIVE DIAGNOSIS:  Breast asymmetry secondary to postsurgical.  PROCEDURE:  Left breast reduction and right breast lateral tissue removal.  ATTENDING:  Wayland Denis, DO  ANESTHESIA:  General.  ASSISTANT:  Shawn Rayburn, PA-C  INDICATION FOR PROCEDURE:  The patient is a 66 year old female who underwent a right mastectomy for breast cancer, and presents for better symmetry.  She desired a left breast reduction with excision of excess tissue on the right lateral aspect.  Risks and complications were reviewed and included bleeding, pain, scar, risk of anesthesia, loss of nipple and infection.  She wished to proceed and consent was signed.  DESCRIPTION OF PROCEDURE:  The patient was taken to the operating room, placed on the operating room table in a supine position.  General anesthesia was administered.  Once adequate, a time-out was called.  All information was confirmed to be correct.  She was prepped and draped in the usual sterile fashion.  She was marked preoperatively for a 7.5 cm vertical limb and then the horizontal limb to match the inframammary fold.  The pedicle was marked at 10 cm in width.  The pedicle was de- epithelialized.  The nipple areola complex was marked with a cookie cutter, and the surrounding skin was de-epithelialized.  The Bovie was then used to lift the medial and lateral flaps.  Hemostasis was achieved with electrocautery.  Once the flaps were lifted, the flaps were thinned and the tissue was sent to Pathology.  The pocket was then irrigated with antibiotic  solution and warm saline.  Hemostasis was achieved with electrocautery.  One drain was placed.  The vertical limbs were brought to the inframammary fold with a 3-0 Vicryl.  An additional 3-0 Vicryls were used to reapproximate the skin edges, followed by a 4-0 Vicryl. The nipple areola complex was then brought out at 5 cm from the inframammary fold.  The point for the new areola was de-epithelialized after it was marked with cookie cutter.  The areola was then brought through intact with 5-0 Vicryl and then 5-0 Monocryl was used to close the skin edges of all of the areas the inframammary fold and vertical limb as well as the areola.  The drain was tacked to the skin edge with 3-0 silk.  Dermabond was applied.  Attention was then turned to the right side.  Prior to beginning the left breast, tumescent was installed which was a combination of lactated Ringer's, epinephrine and Marcaine. Now turning to the right breast, tumescent was used in lateral aspect and the area was liposuctioned.  The previous scar was then excised in elliptical fashion for a better scar as well as removal of some of the excess tissues that was getting in the way.  Hemostasis was achieved with electrocautery.  The deep layers were closed with 3-0 Vicryl, followed by a 4-0 Vicryl running subcuticular, 5-0 Monocryl was used to close the skin.  Dermabond was applied.  ABDs were placed with a breast binder.  The patient tolerated the procedure well.  All tissue was sent to Pathology.  She was awoken and taken to recovery room in stable condition.     Wayland Denis, DO     CS/MEDQ  D:  07/03/2012  T:  07/04/2012  Job:  [id number]

## 2012-07-09 ENCOUNTER — Encounter (HOSPITAL_BASED_OUTPATIENT_CLINIC_OR_DEPARTMENT_OTHER): Payer: Self-pay

## 2014-05-06 ENCOUNTER — Other Ambulatory Visit: Payer: Self-pay | Admitting: Family Medicine

## 2014-05-06 ENCOUNTER — Ambulatory Visit
Admission: RE | Admit: 2014-05-06 | Discharge: 2014-05-06 | Disposition: A | Discharge status: Home / Self Care | Payer: Medicare Other | Source: Ambulatory Visit | Attending: Family Medicine | Admitting: Family Medicine

## 2014-05-06 DIAGNOSIS — R05 Cough: Secondary | ICD-10-CM

## 2014-05-06 DIAGNOSIS — R053 Chronic cough: Secondary | ICD-10-CM

## 2014-06-16 ENCOUNTER — Encounter: Payer: Self-pay | Admitting: *Deleted

## 2014-06-22 ENCOUNTER — Ambulatory Visit (INDEPENDENT_AMBULATORY_CARE_PROVIDER_SITE_OTHER): Payer: Medicare Other | Admitting: Cardiology

## 2014-06-22 ENCOUNTER — Encounter: Payer: Self-pay | Admitting: Cardiology

## 2014-06-22 VITALS — BP 168/92 | HR 110 | Ht 63.0 in | Wt 187.0 lb

## 2014-06-22 DIAGNOSIS — I10 Essential (primary) hypertension: Secondary | ICD-10-CM

## 2014-06-22 DIAGNOSIS — R9431 Abnormal electrocardiogram [ECG] [EKG]: Secondary | ICD-10-CM

## 2014-06-22 DIAGNOSIS — R9389 Abnormal findings on diagnostic imaging of other specified body structures: Secondary | ICD-10-CM

## 2014-06-22 DIAGNOSIS — R002 Palpitations: Secondary | ICD-10-CM

## 2014-06-22 DIAGNOSIS — R938 Abnormal findings on diagnostic imaging of other specified body structures: Secondary | ICD-10-CM

## 2014-06-22 DIAGNOSIS — R011 Cardiac murmur, unspecified: Secondary | ICD-10-CM

## 2014-06-22 NOTE — Patient Instructions (Signed)
Your physician recommends that you schedule a follow-up appointment in:  One year with Dr. Percival Spanish  We are ordering an Echo

## 2014-06-22 NOTE — Progress Notes (Signed)
HPI The patient presents as a new patient. Several weeks ago she had an episode of coughing. She had a chest x-ray and was told there was cardiomegaly. I don't see this on the report I did review the images and I agree that the cardiac silhouette was slightly enlarged. There was apparently some mild abnormality followed by her primary provider. On the date she had the coughing she also had nausea and vomiting. However, this seems to have completely resolved. She has not had any shortness of breath, PND or orthopnea. She denies any chest pressure, neck or arm discomfort. She doesn't have weight gain or edema. She however is not particularly active though she does still do her laundry and household chores. With this she does not report limitations.  No Known Allergies  Current Outpatient Prescriptions  Medication Sig Dispense Refill  . aspirin 81 MG tablet Take 81 mg by mouth daily.      . calcium carbonate 1250 MG capsule Take 1,250 mg by mouth 2 (two) times daily with a meal.      . cholecalciferol (VITAMIN D) 1000 UNITS tablet Take 1,000 Units by mouth daily.      Marland Kitchen levothyroxine (SYNTHROID, LEVOTHROID) 100 MCG tablet Take 100 mcg by mouth daily.      . niacin 500 MG tablet Take 500 mg by mouth daily with breakfast.       No current facility-administered medications for this visit.    Past Medical History  Diagnosis Date  . Hypothyroidism   . Arthritis     Past Surgical History  Procedure Laterality Date  . Breast surgery  1993    right mastectomy-nodes removed  . Tubal ligation    . Abdominal hysterectomy    . De quervain's release  2009    right  . De quervain's release  2006    left  . Colonoscopy      x2  . Mastopexy  07/03/2012    Procedure: MASTOPEXY;  Surgeon: Theodoro Kos, DO;  Location: [place];  Service: Plastics;  Laterality: Left;  left breast mastopexy reduction, left lateral breast excision with liposuction for symmetry  . Breast cyst excision   07/03/2012    Procedure: CYST EXCISION BREAST;  Surgeon: Theodoro Kos, DO;  Location: [place];  Service: Plastics;;  right breast excision of excess lateral tissue    No family history on file.  History   Social History  . Marital Status: Married    Spouse Name: N/A    Number of Children: N/A  . Years of Education: N/A   Occupational History  . Not on file.   Social History Main Topics  . Smoking status: Never Smoker   . Smokeless tobacco: Not on file  . Alcohol Use: No  . Drug Use: No  . Sexual Activity:    Other Topics Concern  . Not on file   Social History Narrative  . No narrative on file    ROS:  As stated in the HPI and negative for all other systems.   PHYSICAL EXAM BP 168/92  Pulse 110  Ht 5\' 3"  (1.6 m)  Wt 187 lb (84.823 kg)  BMI 33.13 kg/m2 GENERAL:  Well appearing HEENT:  Pupils equal round and reactive, fundi not visualized, oral mucosa unremarkable NECK:  No jugular venous distention, waveform within normal limits, carotid upstroke brisk and symmetric, no bruits, no thyromegaly LYMPHATICS:  No cervical, inguinal adenopathy LUNGS:  Clear to auscultation bilaterally BACK:  No CVA tenderness CHEST:  Unremarkable HEART:  PMI not displaced or sustained,S1 and S2 within normal limits, no S3, no S4, no clicks, no rubs, 2/6 apical and right upper sternal border murmur, no diastolic murmurs ABD:  Flat, positive bowel sounds normal in frequency in pitch, no bruits, no rebound, no guarding, no midline pulsatile mass, no hepatomegaly, no splenomegaly EXT:  2 plus pulses throughout, no edema, no cyanosis no clubbing SKIN:  No rashes no nodules NEURO:  Cranial nerves II through XII grossly intact, motor grossly intact throughout PSYCH:  Cognitively intact, oriented to person place and time   EKG:  Sinus tachycardia, rate 110, right bundle branch block, left axis deviation, nonspecific T-wave changes,   ASSESSMENT AND PLAN  ABNORMAL CXR:   I did review these pictures.  There was some cardiomegaly. To evaluate this and the murmur she will have an echocardiogram. Further evaluation will be based on these results. For now she remains fairly asymptomatic.  MURMUR:  Although there was no dynamic component I wonder if there could be some septal hypertrophy. This will be evaluated as above.  HTN:  She will keep a blood pressure diary. She reports her blood pressure is usually well controlled.  RBBB:  This will be evaluated as above. I do not have an EKG for baseline.

## 2014-06-30 ENCOUNTER — Ambulatory Visit (HOSPITAL_COMMUNITY)
Admission: RE | Admit: 2014-06-30 | Discharge: 2014-06-30 | Disposition: A | Discharge status: Home / Self Care | Payer: Medicare Other | Source: Ambulatory Visit | Attending: Cardiovascular Disease | Admitting: Cardiovascular Disease

## 2014-06-30 DIAGNOSIS — R011 Cardiac murmur, unspecified: Secondary | ICD-10-CM | POA: Diagnosis present

## 2014-06-30 DIAGNOSIS — R9431 Abnormal electrocardiogram [ECG] [EKG]: Secondary | ICD-10-CM | POA: Insufficient documentation

## 2014-06-30 DIAGNOSIS — I10 Essential (primary) hypertension: Secondary | ICD-10-CM | POA: Insufficient documentation

## 2014-06-30 DIAGNOSIS — I359 Nonrheumatic aortic valve disorder, unspecified: Secondary | ICD-10-CM

## 2014-06-30 NOTE — Progress Notes (Signed)
2D Echo Performed 06/30/2014    Marygrace Drought, RCS

## 2014-07-06 ENCOUNTER — Telehealth: Payer: Self-pay | Admitting: Cardiology

## 2014-07-06 NOTE — Telephone Encounter (Signed)
Pt would like echo results from last Wednesday please.

## 2014-07-22 ENCOUNTER — Other Ambulatory Visit: Payer: Self-pay | Admitting: Gastroenterology

## 2014-07-22 DIAGNOSIS — R933 Abnormal findings on diagnostic imaging of other parts of digestive tract: Secondary | ICD-10-CM

## 2014-07-22 DIAGNOSIS — Z1211 Encounter for screening for malignant neoplasm of colon: Secondary | ICD-10-CM

## 2014-07-22 DIAGNOSIS — Z853 Personal history of malignant neoplasm of breast: Secondary | ICD-10-CM

## 2014-07-30 ENCOUNTER — Other Ambulatory Visit: Payer: Self-pay | Admitting: Gastroenterology

## 2014-07-30 DIAGNOSIS — K5730 Diverticulosis of large intestine without perforation or abscess without bleeding: Secondary | ICD-10-CM

## 2014-08-02 ENCOUNTER — Ambulatory Visit
Admission: RE | Admit: 2014-08-02 | Discharge: 2014-08-02 | Disposition: A | Discharge status: Home / Self Care | Payer: Medicare Other | Source: Ambulatory Visit | Attending: Gastroenterology | Admitting: Gastroenterology

## 2014-08-02 ENCOUNTER — Inpatient Hospital Stay: Admission: RE | Admit: 2014-08-02 | Payer: Medicare Other | Source: Ambulatory Visit

## 2014-08-02 DIAGNOSIS — K5730 Diverticulosis of large intestine without perforation or abscess without bleeding: Secondary | ICD-10-CM

## 2015-04-04 ENCOUNTER — Telehealth: Payer: Self-pay | Admitting: Cardiology

## 2015-04-04 NOTE — Telephone Encounter (Signed)
Close encounter 

## 2015-06-27 ENCOUNTER — Ambulatory Visit: Payer: Self-pay | Admitting: Cardiology

## 2015-07-06 ENCOUNTER — Ambulatory Visit (INDEPENDENT_AMBULATORY_CARE_PROVIDER_SITE_OTHER): Payer: Medicare Other | Admitting: Cardiology

## 2015-07-06 ENCOUNTER — Encounter: Payer: Self-pay | Admitting: Cardiology

## 2015-07-06 VITALS — BP 144/84 | HR 81 | Ht 63.0 in | Wt 185.1 lb

## 2015-07-06 DIAGNOSIS — I351 Nonrheumatic aortic (valve) insufficiency: Secondary | ICD-10-CM | POA: Diagnosis not present

## 2015-07-06 NOTE — Patient Instructions (Signed)

## 2015-07-06 NOTE — Progress Notes (Signed)
   HPI The patient presents for follow-up of an abnormal echocardiogram with moderate aortic insufficiency and some septal hypertrophy.  This was noted last year when she was found to have a murmur and an abnormal chest x-ray. She doesn't have any symptoms related to this. She is active. She does household chores.  The patient denies any new symptoms such as chest discomfort, neck or arm discomfort. There has been no new shortness of breath, PND or orthopnea. There have been no reported palpitations, presyncope or syncope.   No Known Allergies  Current Outpatient Prescriptions  Medication Sig Dispense Refill  . aspirin 81 MG tablet Take 81 mg by mouth daily.    . calcium carbonate 1250 MG capsule Take 1,250 mg by mouth 2 (two) times daily with a meal.    . cholecalciferol (VITAMIN D) 1000 UNITS tablet Take 1,000 Units by mouth daily.    Marland Kitchen levothyroxine (SYNTHROID, LEVOTHROID) 100 MCG tablet Take 100 mcg by mouth daily.    . simvastatin (ZOCOR) 20 MG tablet Take 1 tablet by mouth daily.  1   No current facility-administered medications for this visit.    Past Medical History  Diagnosis Date  . Hypothyroidism   . Breast cancer     Past Surgical History  Procedure Laterality Date  . Breast surgery  1993    right mastectomy-nodes removed  . Tubal ligation    . Abdominal hysterectomy    . De quervain's release  2009    right  . De quervain's release  2006    left  . Colonoscopy      x2  . Mastopexy  07/03/2012    Procedure: MASTOPEXY;  Surgeon: Theodoro Kos, DO;  Location: [place];  Service: Plastics;  Laterality: Left;  left breast mastopexy reduction, left lateral breast excision with liposuction for symmetry  . Breast cyst excision  07/03/2012    ROS:  As stated in the HPI and negative for all other systems.   PHYSICAL EXAM BP 144/84 mmHg  Pulse 81  Ht 5\' 3"  (1.6 m)  Wt 185 lb 1.6 oz (83.961 kg)  BMI 32.80 kg/m2 GENERAL:  Well appearing NECK:  No  jugular venous distention, waveform within normal limits, carotid upstroke brisk and symmetric, no bruits, no thyromegaly LUNGS:  Clear to auscultation bilaterally HEART:  PMI not displaced or sustained,S1 and S2 within normal limits, no S3, no S4, no clicks, no rubs, 2/6 apical and right upper sternal border murmur, no diastolic murmurs ABD:  Flat, positive bowel sounds normal in frequency in pitch, no bruits, no rebound, no guarding, no midline pulsatile mass, no hepatomegaly, no splenomegaly EXT:  2 plus pulses throughout, no edema, no cyanosis no clubbing   EKG:  Sinus tachycardia, rate 81, right bundle branch block, left axis deviation, nonspecific T-wave changes. 07/06/2015   ASSESSMENT AND PLAN  MODERATE AI/SEPTAL HYPERTROPHY:   I will order a follow up echo.  I suspect that we can follow this clinically.   HTN:  This is very mildly elevated.  She will follow up on this.    RBBB:  This is chronic.  No change in therapy is indicated.

## 2015-07-12 NOTE — Addendum Note (Signed)
Addended by: Fidel Levy on: 07/12/2015 05:33 PM   Modules accepted: Orders

## 2015-07-20 ENCOUNTER — Ambulatory Visit (HOSPITAL_COMMUNITY): Payer: Medicare Other | Attending: Cardiology

## 2015-07-20 ENCOUNTER — Other Ambulatory Visit: Payer: Self-pay

## 2015-07-20 DIAGNOSIS — I517 Cardiomegaly: Secondary | ICD-10-CM | POA: Insufficient documentation

## 2015-07-20 DIAGNOSIS — I10 Essential (primary) hypertension: Secondary | ICD-10-CM | POA: Diagnosis not present

## 2015-07-20 DIAGNOSIS — I358 Other nonrheumatic aortic valve disorders: Secondary | ICD-10-CM | POA: Diagnosis not present

## 2015-07-20 DIAGNOSIS — I351 Nonrheumatic aortic (valve) insufficiency: Secondary | ICD-10-CM | POA: Insufficient documentation

## 2015-07-27 ENCOUNTER — Telehealth: Payer: Self-pay | Admitting: Cardiology

## 2015-07-27 NOTE — Telephone Encounter (Signed)
Donna Kirk is calling to get her Echo results .Marland Kitchen

## 2015-07-27 NOTE — Telephone Encounter (Signed)
Gave pt echo results and forwarded results to Dr. Arelia Sneddon.

## 2016-01-16 ENCOUNTER — Other Ambulatory Visit: Payer: Self-pay | Admitting: Family Medicine

## 2016-01-16 ENCOUNTER — Ambulatory Visit
Admission: RE | Admit: 2016-01-16 | Discharge: 2016-01-16 | Disposition: A | Discharge status: Home / Self Care | Payer: Medicare Other | Source: Ambulatory Visit | Attending: Family Medicine | Admitting: Family Medicine

## 2016-01-16 DIAGNOSIS — R52 Pain, unspecified: Secondary | ICD-10-CM

## 2016-07-14 LAB — GLUCOSE, POCT (MANUAL RESULT ENTRY): POC Glucose: 114 mg/dl — AB (ref 70–99)

## 2016-08-08 NOTE — Progress Notes (Signed)
   HPI The patient presents for follow-up of an abnormal echocardiogram with moderate aortic insufficiency and some septal hypertrophy.  This was noted last year when she was found to have a murmur and an abnormal chest x-ray. 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  She returns for follow up.   She has done well.  The patient denies any new symptoms such as chest discomfort, neck or arm discomfort. There has been no new shortness of breath, PND or orthopnea. There have been no reported palpitations, presyncope or syncope.  She remains active at home.    No Known Allergies  Current Outpatient Prescriptions  Medication Sig Dispense Refill  . aspirin 81 MG tablet Take 81 mg by mouth daily.    . calcium carbonate 1250 MG capsule Take 600 mg by mouth 2 (two) times daily with a meal.     . cholecalciferol (VITAMIN D) 1000 UNITS tablet Take 2,000 Units by mouth daily.     Marland Kitchen levothyroxine (SYNTHROID, LEVOTHROID) 100 MCG tablet Take 100 mcg by mouth daily.    . simvastatin (ZOCOR) 20 MG tablet Take 1 tablet by mouth daily.  1   No current facility-administered medications for this visit.     Past Medical History:  Diagnosis Date  . Breast cancer ([place])   . Hypothyroidism     Past Surgical History:  Procedure Laterality Date  . ABDOMINAL HYSTERECTOMY    . BREAST CYST EXCISION  07/03/2012  . [place]   right mastectomy-nodes removed  . COLONOSCOPY     x2  . [place]  2009   right  . DE QUERVAIN'S RELEASE  2006   left  . MASTOPEXY  07/03/2012   Procedure: MASTOPEXY;  Surgeon: Theodoro Kos, DO;  Location: [place];  Service: Plastics;  Laterality: Left;  left breast mastopexy reduction, left lateral breast excision with liposuction for symmetry  . TUBAL LIGATION      ROS:    As stated in the HPI and negative for all other systems.   PHYSICAL EXAM BP 140/84   Pulse 95   Ht 5\' 3"  (1.6 m)    Wt 186 lb (84.4 kg)   BMI 32.95 kg/m  GENERAL:  Well appearing NECK:  No jugular venous distention, waveform within normal limits, carotid upstroke brisk and symmetric, no bruits, no thyromegaly LUNGS:  Clear to auscultation bilaterally HEART:  PMI not displaced or sustained,S1 and S2 within normal limits, no S3, no S4, no clicks, no rubs, 2/6 apical and right upper sternal border murmur, no diastolic murmurs ABD:  Flat, positive bowel sounds normal in frequency in pitch, no bruits, no rebound, no guarding, no midline pulsatile mass, no hepatomegaly, no splenomegaly EXT:  2 plus pulses throughout, no edema, no cyanosis no clubbing   EKG:  Sinus tachycardia, rate 95, right bundle branch block, left axis deviation, nonspecific T-wave changes. 08/09/2016   ASSESSMENT AND PLAN  MODERATE AI/SEPTAL HYPERTROPHY:   This was not prominent on the echo last year.  Her valve disease is mild.  No change in therapy is indicated.    HTN:  Her blood pressure diary at home was fine. No change in therapy is indicated.  RBBB:  This is chronic.  No change in therapy is indicated.

## 2016-08-09 ENCOUNTER — Ambulatory Visit (INDEPENDENT_AMBULATORY_CARE_PROVIDER_SITE_OTHER): Payer: Medicare Other | Admitting: Cardiology

## 2016-08-09 ENCOUNTER — Encounter: Payer: Self-pay | Admitting: Cardiology

## 2016-08-09 VITALS — BP 140/84 | HR 95 | Ht 63.0 in | Wt 186.0 lb

## 2016-08-09 DIAGNOSIS — I10 Essential (primary) hypertension: Secondary | ICD-10-CM | POA: Diagnosis not present

## 2016-08-09 DIAGNOSIS — I351 Nonrheumatic aortic (valve) insufficiency: Secondary | ICD-10-CM

## 2016-08-09 NOTE — Patient Instructions (Signed)

## 2017-02-06 ENCOUNTER — Other Ambulatory Visit: Payer: Self-pay | Admitting: Family Medicine

## 2017-02-06 ENCOUNTER — Ambulatory Visit
Admission: RE | Admit: 2017-02-06 | Discharge: 2017-02-06 | Disposition: A | Discharge status: Home / Self Care | Payer: Medicare Other | Source: Ambulatory Visit | Attending: Family Medicine | Admitting: Family Medicine

## 2017-02-06 DIAGNOSIS — M79671 Pain in right foot: Secondary | ICD-10-CM

## 2018-07-12 LAB — GLUCOSE, POCT (MANUAL RESULT ENTRY): POC Glucose: 136 mg/dl — AB (ref 70–99)

## 2019-03-31 ENCOUNTER — Telehealth: Payer: Self-pay

## 2019-03-31 NOTE — Telephone Encounter (Signed)
7-22 @140  [facility]     COVID-19 Pre-Screening Questions:  . In the past 7 to 10 days have you had a cough,  shortness of breath, headache, congestion, fever (100 or greater) body aches, chills, sore throat, or sudden loss of taste or sense of smell? NO . Have you been around anyone with known Covid 19. NO . Have you been around anyone who is awaiting Covid 19 test results in the past 7 to 10 days? NO . Have you been around anyone who has been exposed to Covid 19, or has mentioned symptoms of Covid 19 within the past 7 to 10 days? NO  PT WILL ARRIVE EARLY W/MASK AND NO VISITORS

## 2019-03-31 NOTE — Progress Notes (Signed)
Cardiology Office Note   Date:  04/01/2019   ID:  Donna Kirk, DOB 07-06-1946, MRN [id number]  PCP:  [name], MD  Cardiologist:   Minus Breeding, MD   Chief Complaint  Patient presents with  . Heart Murmur      History of Present Illness: Donna Kirk is a 73 y.o. female who presents for follow-up of an abnormal echocardiogram with moderate aortic insufficiency and some septal hypertrophy.  This on echo when she was found to have a murmur and an abnormal chest x-ray. She had aortic sclerosis and mild AI on echo but no other significant abnormalities.  I last saw her in 2017.    Since I last saw her she has done well.  The patient denies any new symptoms such as chest discomfort, neck or arm discomfort. There has been no new shortness of breath, PND or orthopnea. There have been no reported palpitations, presyncope or syncope.  She does her chores and walks the dog.     Past Medical History:  Diagnosis Date  . Breast cancer ([place])   . Hypothyroidism     Past Surgical History:  Procedure Laterality Date  . ABDOMINAL HYSTERECTOMY    . BREAST CYST EXCISION  07/03/2012  . [place]   right mastectomy-nodes removed  . COLONOSCOPY     x2  . [place]  2009   right  . DE QUERVAIN'S RELEASE  2006   left  . MASTOPEXY  07/03/2012   Procedure: MASTOPEXY;  Surgeon: Theodoro Kos, DO;  Location: [place];  Service: Plastics;  Laterality: Left;  left breast mastopexy reduction, left lateral breast excision with liposuction for symmetry  . TUBAL LIGATION       Current Outpatient Medications  Medication Sig Dispense Refill  . aspirin 81 MG tablet Take 81 mg by mouth daily.    . calcium carbonate 1250 MG capsule Take 600 mg by mouth 2 (two) times daily with a meal.     . chlorthalidone (HYGROTON) 25 MG tablet Take 1 tablet (25 mg total) by mouth daily. 90 tablet 3  . cholecalciferol (VITAMIN D) 1000 UNITS tablet  Take 2,000 Units by mouth daily.     Marland Kitchen levothyroxine (SYNTHROID, LEVOTHROID) 100 MCG tablet Take 100 mcg by mouth daily.    . simvastatin (ZOCOR) 20 MG tablet Take 1 tablet by mouth daily.  1   No current facility-administered medications for this visit.     Allergies:   Patient has no known allergies.    ROS:  Please see the history of present illness.   Otherwise, review of systems are positive for none.   All other systems are reviewed and negative.    PHYSICAL EXAM: VS:  BP (!) 152/83   Pulse 98   Temp 98.6 F (37 C) (Temporal)   Ht 5\' 3"  (1.6 m)   Wt 183 lb (83 kg)   SpO2 95%   BMI 32.42 kg/m  , BMI Body mass index is 32.42 kg/m. GENERAL:  Well appearing NECK:  No jugular venous distention, waveform within normal limits, carotid upstroke brisk and symmetric, no bruits, no thyromegaly LUNGS:  Clear to auscultation bilaterally BACK:  No CVA tenderness CHEST:  Unremarkable HEART:  PMI not displaced or sustained,S1 and S2 within normal limits, no S3, no S4, no clicks, no rubs, 2 out of 6 apical systolic murmur radiating slightly at the aortic outflow tract, also in the right upper sternal  border and left upper sternal border, early peaking, no diastolic murmurs ABD:  Flat, positive bowel sounds normal in frequency in pitch, no bruits, no rebound, no guarding, no midline pulsatile mass, no hepatomegaly, no splenomegaly EXT:  2 plus pulses throughout, no edema, no cyanosis no clubbing    EKG:  EKG is ordered today. The ekg ordered today demonstrates right bundle branch block block, left axis deviation, could not exclude old inferior infarct.   Recent Labs: No results found for requested labs within last 8760 hours.    Lipid Panel No results found for: CHOL, TRIG, HDL, CHOLHDL, VLDL, LDLCALC, LDLDIRECT    Wt Readings from Last 3 Encounters:  04/01/19 183 lb (83 kg)  08/09/16 186 lb (84.4 kg)  07/15/16 186 lb (84.4 kg)      Other studies Reviewed: Additional  studies/ records that were reviewed today include: None. Review of the above records demonstrates:  Please see elsewhere in the note.     ASSESSMENT AND PLAN:  MODERATE AI/SEPTAL HYPERTROPHY:    The patient does need follow-up echocardiography.  Further evaluation will based on these results.   HTN:  Blood pressure is elevated.     This has been elevated before.  I am going to add chlorthalidone 25 mg daily with a basic metabolic profile in 2 weeks when she comes back for her echo.  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  No change in therapy.    Current medicines are reviewed at length with the patient today.  The patient does not have concerns regarding medicines.  The following changes have been made:  As above  Labs/ tests ordered today include:   Orders Placed This Encounter  Procedures  . Basic metabolic panel  . EKG 12-Lead  . ECHOCARDIOGRAM COMPLETE     Disposition:   FU with me in one year or based on echo results    Signed, Minus Breeding, MD  04/01/2019 2:22 PM    [place]

## 2019-04-01 ENCOUNTER — Encounter (INDEPENDENT_AMBULATORY_CARE_PROVIDER_SITE_OTHER): Payer: Self-pay

## 2019-04-01 ENCOUNTER — Ambulatory Visit (INDEPENDENT_AMBULATORY_CARE_PROVIDER_SITE_OTHER): Payer: Medicare Other | Admitting: Cardiology

## 2019-04-01 ENCOUNTER — Other Ambulatory Visit: Payer: Self-pay

## 2019-04-01 ENCOUNTER — Encounter: Payer: Self-pay | Admitting: Cardiology

## 2019-04-01 VITALS — BP 152/83 | HR 98 | Temp 98.6°F | Ht 63.0 in | Wt 183.0 lb

## 2019-04-01 DIAGNOSIS — I10 Essential (primary) hypertension: Secondary | ICD-10-CM | POA: Diagnosis not present

## 2019-04-01 DIAGNOSIS — I4510 Unspecified right bundle-branch block: Secondary | ICD-10-CM | POA: Diagnosis not present

## 2019-04-01 DIAGNOSIS — I351 Nonrheumatic aortic (valve) insufficiency: Secondary | ICD-10-CM

## 2019-04-01 DIAGNOSIS — Z79899 Other long term (current) drug therapy: Secondary | ICD-10-CM

## 2019-04-01 MED ORDER — CHLORTHALIDONE 25 MG PO TABS: 25.0000 mg | ORAL_TABLET | Freq: Every day | ORAL | 3 refills | Status: DC

## 2019-04-01 NOTE — Patient Instructions (Addendum)
Medication Instructions:  START CHLORTHALIDONE 25MG  DAILY  If you need a refill on your cardiac medications before your next appointment, please call your pharmacy.  Labwork: BMET IN 2 WEEKS-WHEN YOU ARE HERE FOR YOUR ECHO HERE IN OUR OFFICE AT [facility]   You will NOT need to fast   Take the provided lab slips with you to the lab for your blood draw.   When you have your labs (blood work) drawn today and your tests are completely normal, you will receive your results only by MyChart Message (if you have MyChart) -OR-  A paper copy in the mail.  If you have any lab test that is abnormal or we need to change your treatment, we will call you to review these results.  Testing/Procedures: Echocardiogram (IN 2 WEEKS)- Your physician has requested that you have an echocardiogram. Echocardiography is a painless test that uses sound waves to create images of your heart. It provides your doctor with information about the size and shape of your heart and how well your heart's chambers and valves are working. This procedure takes approximately one hour. There are no restrictions for this procedure. This will be performed at our [facility] location - [address].  Follow-Up: You will need a follow up appointment in 12 months.  Please call our office 2 months in advance, 01-2020 to schedule this appointment.  You may see Minus Breeding, MD or one of the following Advanced Practice Providers on your designated Care Team:   Rosaria Ferries, PA-C . Jory Sims, DNP, ANP     At [facility], you and your health needs are our priority.  As part of our continuing mission to provide you with exceptional heart care, we have created designated Provider Care Teams.  These Care Teams include your primary Cardiologist (physician) and Advanced Practice Providers (APPs -  Physician Assistants and Nurse Practitioners) who all work together to provide you with the care you need, when you need it.  Thank  you for choosing [facility] at [facility]!!

## 2019-04-15 ENCOUNTER — Ambulatory Visit (HOSPITAL_COMMUNITY): Payer: Medicare Other | Attending: Internal Medicine

## 2019-04-15 ENCOUNTER — Other Ambulatory Visit: Payer: Self-pay

## 2019-04-15 DIAGNOSIS — I351 Nonrheumatic aortic (valve) insufficiency: Secondary | ICD-10-CM | POA: Insufficient documentation

## 2019-04-16 LAB — BASIC METABOLIC PANEL
BUN/Creatinine Ratio: 18 (ref 12–28)
BUN: 17 mg/dL (ref 8–27)
CO2: 22 mmol/L (ref 20–29)
Calcium: 9.7 mg/dL (ref 8.7–10.3)
Chloride: 100 mmol/L (ref 96–106)
Creatinine, Ser: 0.97 mg/dL (ref 0.57–1.00)
GFR calc Af Amer: 67 mL/min/{1.73_m2} (ref >59)
GFR calc non Af Amer: 58 mL/min/{1.73_m2} — ABNORMAL LOW (ref >59)
Glucose: 133 mg/dL — ABNORMAL HIGH (ref 65–99)
Potassium: 3.7 mmol/L (ref 3.5–5.2)
Sodium: 142 mmol/L (ref 134–144)

## 2019-04-17 ENCOUNTER — Encounter: Payer: Self-pay | Admitting: *Deleted

## 2019-09-02 ENCOUNTER — Other Ambulatory Visit: Payer: Self-pay | Admitting: Gastroenterology

## 2019-09-02 DIAGNOSIS — R195 Other fecal abnormalities: Secondary | ICD-10-CM

## 2019-09-15 ENCOUNTER — Ambulatory Visit
Admission: RE | Admit: 2019-09-15 | Discharge: 2019-09-15 | Disposition: A | Discharge status: Home / Self Care | Payer: Medicare Other | Source: Ambulatory Visit | Attending: Gastroenterology | Admitting: Gastroenterology

## 2019-09-15 DIAGNOSIS — R195 Other fecal abnormalities: Secondary | ICD-10-CM

## 2019-09-22 ENCOUNTER — Telehealth: Payer: Self-pay

## 2019-09-22 NOTE — Telephone Encounter (Signed)
   [facility] Pre-operative Risk Assessment    Request for surgical clearance:  1. What type of surgery is being performed? FLEX SIG     2. When is this surgery scheduled? 09-23-2019  3. What type of clearance is required (medical clearance vs. Pharmacy clearance to hold med vs. Both)? MEDICAL  4. Are there any medications that need to be held prior to surgery and how long? NONE LISTED   5. Practice name and name of physician performing surgery? [facility]    6. What is your office phone number [phone]    7.   What is your office fax number [phone]  8.   Anesthesia type (None, local, MAC, general) ? PROPOFOL  CLEARANCE FOR ANESTHESIA

## 2019-09-22 NOTE — Telephone Encounter (Signed)
   Primary Cardiologist: Minus Breeding, MD  Chart reviewed as part of pre-operative protocol coverage. Patient has a history of moderate aortic insufficiency. Last seen by Dr. Percival Spanish on 04/01/2019 at which time she was doing well from a cardiac standpoint. She had updated Echo on 04/15/2019 which showed LVEF of >65% with mild LVH and trivial aortic insufficiency. I called and spoke with patient today. She reports doing well since she last saw Dr. Percival Spanish. No chest pain, shortness of breath, orthopnea, PND, edema, palpitations or syncope. She is very active on her 2 acre property and is able to walk on incline and upstairs without any problems. Given past medical history and time since last visit, based on ACC/AHA guidelines, Donna Kirk would be at acceptable risk for the planned procedure without further cardiovascular testing.   I will route this recommendation to the requesting party via Epic fax function and remove from pre-op pool.  Please call with questions.  Darreld Mclean, PA-C 09/22/2019, 9:17 AM

## 2020-01-26 ENCOUNTER — Other Ambulatory Visit: Payer: Self-pay

## 2020-01-26 MED ORDER — CHLORTHALIDONE 25 MG PO TABS: 25.0000 mg | ORAL_TABLET | Freq: Every day | ORAL | 0 refills | Status: DC

## 2020-01-27 NOTE — Progress Notes (Signed)
Virtual Visit via Telephone Note   This visit type was conducted due to national recommendations for restrictions regarding the COVID-19 Pandemic (e.g. social distancing) in an effort to limit this patient's exposure and mitigate transmission in our community.  Due to her co-morbid illnesses, this patient is at least at moderate risk for complications without adequate follow up.  This format is felt to be most appropriate for this patient at this time.  The patient did not have access to video technology/had technical difficulties with video requiring transitioning to audio format only (telephone).  All issues noted in this document were discussed and addressed.  No physical exam could be performed with this format.  Please refer to the patient's chart for her  consent to telehealth for [facility].   Date:  01/28/2020   ID:  Donna Kirk, DOB [birth year], MRN [id number]  Patient Location: Home Provider Location: Home  PCP:  [name], MD  Cardiologist:  Minus Breeding, MD  Electrophysiologist:  None   Evaluation Performed:  Follow-Up Visit  Chief Complaint:  Hypertension   History of Present Illness:    Donna Kirk is a 74 y.o. female  who presents for ongoing assessment and management of moderate aortic valve insufficiency, most recent echo 04/15/2019 which revealed LVEF greater than 65% with mild LVH and trivial aortic insufficiency.  Last encounter was on 09/22/2019 by Sande Rives, PA via virtual telephone visit.  She had some planned surgery and was cleared from cardiology standpoint at that time.  Having some dizzy spells after she takes the chlorthalidone. Passes quickly. She has actually run out of her chlorthalidone and needs refills. She is not complaining of chest pain, DOE, fatigue, or near syncope. She is otherwise medically compliant. Has not had labs drawn in a while. Supposed to see PCP and is making appt with them today.    The patient does not  have symptoms concerning for COVID-19 infection (fever, chills, cough, or new shortness of breath).    Past Medical History:  Diagnosis Date  . Breast cancer ([place])   . Hypothyroidism    Past Surgical History:  Procedure Laterality Date  . ABDOMINAL HYSTERECTOMY    . BREAST CYST EXCISION  07/03/2012  . [place]   right mastectomy-nodes removed  . COLONOSCOPY     x2  . [place]  2009   right  . DE QUERVAIN'S RELEASE  2006   left  . MASTOPEXY  07/03/2012   Procedure: MASTOPEXY;  Surgeon: Theodoro Kos, DO;  Location: [place];  Service: Plastics;  Laterality: Left;  left breast mastopexy reduction, left lateral breast excision with liposuction for symmetry  . TUBAL LIGATION       Current Meds  Medication Sig  . calcium carbonate 1250 MG capsule Take 600 mg by mouth 2 (two) times daily with a meal.   . chlorthalidone (HYGROTON) 25 MG tablet Take 1 tablet (25 mg total) by mouth daily.  . cholecalciferol (VITAMIN D) 1000 UNITS tablet Take 2,000 Units by mouth daily.   Marland Kitchen levothyroxine (SYNTHROID, LEVOTHROID) 100 MCG tablet Take 100 mcg by mouth daily.  . simvastatin (ZOCOR) 20 MG tablet Take 1 tablet by mouth daily.  . [DISCONTINUED] aspirin 81 MG tablet Take 81 mg by mouth daily.  . [DISCONTINUED] chlorthalidone (HYGROTON) 25 MG tablet Take 1 tablet (25 mg total) by mouth daily. Please keep upcoming appt with Dr. Percival Spanish in August before anymore refills. Thank you  Allergies:   Patient has no known allergies.   Social History   Tobacco Use  . Smoking status: Never Smoker  . Smokeless tobacco: Never Used  Substance Use Topics  . Alcohol use: No  . Drug use: No     Family Hx: The patient's family history includes CAD in her father.  ROS:   Please see the history of present illness.   All other systems reviewed and are negative.   Prior CV studies:   The following studies were reviewed today: Echocardiogram 04/15/2019  1.  The left ventricle has hyperdynamic systolic function, with an  ejection fraction of >65%. The cavity size was normal. There is mildly  increased left ventricular wall thickness. Indeterminate diastolic filling  due to E-A fusion.  2. The right ventricle has normal systolic function. The cavity was  normal. There is no increase in right ventricular wall thickness. Right  ventricular systolic pressure is normal with an estimated pressure of 23.5  mmHg.  3. The aortic valve is tricuspid. Sclerosis without any evidence of  stenosis of the aortic valve. Aortic valve regurgitation is trivial by  color flow Doppler.    Labs/Other Tests and Data Reviewed:    EKG:  No ECG reviewed.  Recent Labs: 04/15/2019: BUN 17; Creatinine, Ser 0.97; Potassium 3.7; Sodium 142   Recent Lipid Panel No results found for: CHOL, TRIG, HDL, CHOLHDL, LDLCALC, LDLDIRECT  Wt Readings from Last 3 Encounters:  01/28/20 177 lb (80.3 kg)  04/01/19 183 lb (83 kg)  08/09/16 186 lb (84.4 kg)     Objective:    Vital Signs:  BP (!) 155/88   Pulse (!) 105   Ht 5\' 3"  (1.6 m)   Wt 177 lb (80.3 kg)   BMI 31.35 kg/m    VITAL SIGNS:  reviewed GEN:  no acute distress RESPIRATORY:  normal respiratory effort, symmetric expansion NEURO:  alert and oriented x 3, no obvious focal deficit PSYCH:  normal affect  ASSESSMENT & PLAN:    1. Hypertension;  BP is elevated today. She is out of her chlorthalidone.  Will provide refills of 25 mg daily. She states that sometimes she has some brief dizziness when she takes it. I have asked her to keep up with her BP recordings. If BP is < 120/60 will need to decrease the dose to 12.5 mg daily. She is due for labs. She wants to have these done by PCP.  She is making appt with them today.  2. Hyperlipidemia: She remains on statin therapy with low dose simvastatin 20 mg daily.  She will need fasting labs. Goal of LDL < 70. She will have then drawn by [place] if PCP appt is not soon,  3.  Hypothyroidism. On levothyroxine. She is followed by PCP for this.   COVID-19 Education: The signs and symptoms of COVID-19 were discussed with the patient and how to seek care for testing (follow up with PCP or arrange E-visit). The importance of social distancing was discussed today.  Time:   Today, I have spent 20 minutes with the patient with telehealth technology discussing the above problems.     Medication Adjustments/Labs and Tests Ordered: Current medicines are reviewed at length with the patient today.  Concerns regarding medicines are outlined above.   Tests Ordered: No orders of the defined types were placed in this encounter.   Medication Changes: Meds ordered this encounter  Medications  . chlorthalidone (HYGROTON) 25 MG tablet    Sig: Take 1 tablet (  25 mg total) by mouth daily.    Dispense:  90 tablet    Refill:  3    Pt must keep upcoming appt in August with Dr. Percival Spanish before anymore refills. Thank you    Disposition:  Follow up previously scheduled appt with Dr. Percival Spanish in August of 2021.   Signed, Phill Myron. West Pugh, ANP, AACC  01/28/2020 8:36 AM    [facility]

## 2020-01-28 ENCOUNTER — Telehealth (INDEPENDENT_AMBULATORY_CARE_PROVIDER_SITE_OTHER): Payer: Medicare Other | Admitting: Adult Health

## 2020-01-28 ENCOUNTER — Telehealth: Payer: Medicare Other | Admitting: Adult Health

## 2020-01-28 ENCOUNTER — Encounter: Payer: Self-pay | Admitting: Adult Health

## 2020-01-28 VITALS — BP 155/88 | HR 105 | Ht 63.0 in | Wt 177.0 lb

## 2020-01-28 DIAGNOSIS — Z953 Presence of xenogenic heart valve: Secondary | ICD-10-CM | POA: Diagnosis not present

## 2020-01-28 DIAGNOSIS — E7800 Pure hypercholesterolemia, unspecified: Secondary | ICD-10-CM | POA: Diagnosis not present

## 2020-01-28 DIAGNOSIS — I10 Essential (primary) hypertension: Secondary | ICD-10-CM | POA: Diagnosis not present

## 2020-01-28 DIAGNOSIS — E039 Hypothyroidism, unspecified: Secondary | ICD-10-CM

## 2020-01-28 MED ORDER — CHLORTHALIDONE 25 MG PO TABS: 25.0000 mg | ORAL_TABLET | Freq: Every day | ORAL | 3 refills | Status: DC

## 2020-01-28 NOTE — Patient Instructions (Signed)
Medication Instructions:  Continue current medications  *If you need a refill on your cardiac medications before your next appointment, please call your pharmacy*   Lab Work: None Ordered   Testing/Procedures: None ordered   Follow-Up: At [facility], you and your health needs are our priority.  As part of our continuing mission to provide you with exceptional heart care, we have created designated Provider Care Teams.  These Care Teams include your primary Cardiologist (physician) and Advanced Practice Providers (APPs -  Physician Assistants and Nurse Practitioners) who all work together to provide you with the care you need, when you need it.  We recommend signing up for the patient portal called "MyChart".  Sign up information is provided on this After Visit Summary.  MyChart is used to connect with patients for Virtual Visits (Telemedicine).  Patients are able to view lab/test results, encounter notes, upcoming appointments, etc.  Non-urgent messages can be sent to your provider as well.   To learn more about what you can do with MyChart, go to [email or web address].    Your next appointment:    Keep appointment with Dr Percival Spanish August 2nd @ 12:20 pm  The format for your next appointment:   In Person  Provider:   Minus Breeding, MD

## 2020-02-04 ENCOUNTER — Telehealth: Payer: Self-pay | Admitting: Adult Health

## 2020-02-04 ENCOUNTER — Telehealth: Payer: Self-pay | Admitting: Cardiology

## 2020-02-04 NOTE — Telephone Encounter (Signed)
Call and spoke with pt concerning her AVS she received from her last visit, pt stated it says she have an aortic valve replacement, advised pt to bring it with her to her next visit, pt voice understanding and thanks for the call back

## 2020-02-04 NOTE — Telephone Encounter (Signed)
Meds adjusted per pt's directions./cy

## 2020-02-04 NOTE — Telephone Encounter (Signed)
Pt c/o medication issue:  1. Name of Medication:   cholecalciferol (VITAMIN D) 1000 UNITS tablet   2. How are you currently taking this medication (dosage and times per day)? Not currently taking medication   3. Are you having a reaction (difficulty breathing--STAT)? No   4. What is your medication issue? Patient is calling stating she received a letter listing her current medications and she is not taking this dosage of Vitamin D. She is only taking 200 units daily not 2000 as listed on the sheet and current medication.

## 2020-02-04 NOTE — Telephone Encounter (Signed)
Donna Kirk is calling stating she received a letter from Jory Sims talking about an aortic valve replacement. [name] states she's very confused by this due to never having this procedure preformed. Please advise.

## 2020-04-10 DIAGNOSIS — I4510 Unspecified right bundle-branch block: Secondary | ICD-10-CM | POA: Insufficient documentation

## 2020-04-10 DIAGNOSIS — I422 Other hypertrophic cardiomyopathy: Secondary | ICD-10-CM | POA: Insufficient documentation

## 2020-04-10 NOTE — Progress Notes (Signed)
Cardiology Office Note   Date:  04/11/2020   ID:  Donna Kirk, DOB 1946-03-18, MRN [id number]  PCP:  [name], MD  Cardiologist:   Minus Breeding, MD   Chief Complaint  Patient presents with  . Abnormal echo      History of Present Illness: Donna Kirk is a 74 y.o. female who presents for follow-up of an abnormal echocardiogram with moderate aortic insufficiency and some septal hypertrophy.  This on echo when she was found to have a murmur and an abnormal chest x-ray. She had aortic sclerosis and mild AI on echo but no other significant abnormalities.  Since she was last seen she has done well. The patient denies any new symptoms such as chest discomfort, neck or arm discomfort. There has been no new shortness of breath, PND or orthopnea. There have been no reported palpitations, presyncope or syncope.  She walks his dog which is a Neurosurgeon.   Past Medical History:  Diagnosis Date  . Breast cancer ([place])   . Hypothyroidism     Past Surgical History:  Procedure Laterality Date  . ABDOMINAL HYSTERECTOMY    . BREAST CYST EXCISION  07/03/2012  . [place]   right mastectomy-nodes removed  . COLONOSCOPY     x2  . [place]  2009   right  . DE QUERVAIN'S RELEASE  2006   left  . MASTOPEXY  07/03/2012   Procedure: MASTOPEXY;  Surgeon: Theodoro Kos, DO;  Location: [place];  Service: Plastics;  Laterality: Left;  left breast mastopexy reduction, left lateral breast excision with liposuction for symmetry  . TUBAL LIGATION       Current Outpatient Medications  Medication Sig Dispense Refill  . chlorthalidone (HYGROTON) 25 MG tablet Take 1 tablet (25 mg total) by mouth daily. 90 tablet 3  . Cholecalciferol (VITAMIN D3) 50 MCG (2000 UT) TABS Take by mouth.    . levothyroxine (SYNTHROID, LEVOTHROID) 100 MCG tablet Take 100 mcg by mouth daily.    . naproxen sodium (ALEVE) 220 MG tablet Take 220 mg by  mouth.    . simvastatin (ZOCOR) 20 MG tablet Take 1 tablet by mouth daily.  1   No current facility-administered medications for this visit.    Allergies:   Patient has no known allergies.    ROS:  Please see the history of present illness.   Otherwise, review of systems are positive for none.   All other systems are reviewed and negative.    PHYSICAL EXAM: VS:  BP 124/80 (BP Location: Left Arm, Patient Position: Sitting, Cuff Size: Normal)   Pulse 89   Ht 5\' 3"  (1.6 m)   Wt 168 lb 3.2 oz (76.3 kg)   BMI 29.80 kg/m  , BMI Body mass index is 29.8 kg/m. GENERAL:  Well appearing NECK:  No jugular venous distention, waveform within normal limits, carotid upstroke brisk and symmetric, no bruits, no thyromegaly LUNGS:  Clear to auscultation bilaterally CHEST:  Unremarkable HEART:  PMI not displaced or sustained,S1 and S2 within normal limits, no S3, no S4, no clicks, no rubs, soft apical brief systolic murmur nonradiating, no diastolic murmurs ABD:  Flat, positive bowel sounds normal in frequency in pitch, no bruits, no rebound, no guarding, no midline pulsatile mass, no hepatomegaly, no splenomegaly EXT:  2 plus pulses throughout, no edema, no cyanosis no clubbing   EKG:  EKG is ordered today. The ekg ordered today demonstrates rate  89 right bundle branch block block, left axis deviation, could not exclude old inferior infarct.   Recent Labs: 04/15/2019: BUN 17; Creatinine, Ser 0.97; Potassium 3.7; Sodium 142    Lipid Panel No results found for: CHOL, TRIG, HDL, CHOLHDL, VLDL, LDLCALC, LDLDIRECT    Wt Readings from Last 3 Encounters:  04/11/20 168 lb 3.2 oz (76.3 kg)  01/28/20 177 lb (80.3 kg)  04/01/19 183 lb (83 kg)      Other studies Reviewed: Additional studies/ records that were reviewed today include: None. Review of the above records demonstrates:  None    ASSESSMENT AND PLAN:  MODERATE AI/SEPTAL HYPERTROPHY:   She is not having any problems related to this.   There is no dynamic outflow obstruction.  I will follow up with a physical exam in couple of years but no further imaging is indicated.  HTN:  Blood pressure is at target.  No change in therapy.   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  I reviewed this with her today.   COVID EDUCATION: She has been vaccinated.  Current medicines are reviewed at length with the patient today.  The patient does not have concerns regarding medicines.  The following changes have been made:  None  Labs/ tests ordered today include:  None  Orders Placed This Encounter  Procedures  . EKG 12-Lead     Disposition:   FU with me in a couple of years.    Signed, Minus Breeding, MD  04/11/2020 12:45 PM    [place]

## 2020-04-11 ENCOUNTER — Ambulatory Visit: Payer: Medicare Other | Admitting: Cardiology

## 2020-04-11 ENCOUNTER — Encounter: Payer: Self-pay | Admitting: Cardiology

## 2020-04-11 ENCOUNTER — Other Ambulatory Visit: Payer: Self-pay

## 2020-04-11 VITALS — BP 124/80 | HR 89 | Ht 63.0 in | Wt 168.2 lb

## 2020-04-11 DIAGNOSIS — I4510 Unspecified right bundle-branch block: Secondary | ICD-10-CM

## 2020-04-11 DIAGNOSIS — I422 Other hypertrophic cardiomyopathy: Secondary | ICD-10-CM

## 2020-04-11 DIAGNOSIS — I10 Essential (primary) hypertension: Secondary | ICD-10-CM

## 2020-04-11 NOTE — Patient Instructions (Signed)
Medication Instructions:  Your physician recommends that you continue on your current medications as directed. Please refer to the Current Medication list given to you today.  *If you need a refill on your cardiac medications before your next appointment, please call your pharmacy*  Lab Work: NONE ordered at this time of appointment   If you have labs (blood work) drawn today and your tests are completely normal, you will receive your results only by: Marland Kitchen MyChart Message (if you have MyChart) OR . A paper copy in the mail If you have any lab test that is abnormal or we need to change your treatment, we will call you to review the results.  Testing/Procedures: NONE ordered at this time of appointment   Follow-Up: At [facility], you and your health needs are our priority.  As part of our continuing mission to provide you with exceptional heart care, we have created designated Provider Care Teams.  These Care Teams include your primary Cardiologist (physician) and Advanced Practice Providers (APPs -  Physician Assistants and Nurse Practitioners) who all work together to provide you with the care you need, when you need it.  We recommend signing up for the patient portal called "MyChart".  Sign up information is provided on this After Visit Summary.  MyChart is used to connect with patients for Virtual Visits (Telemedicine).  Patients are able to view lab/test results, encounter notes, upcoming appointments, etc.  Non-urgent messages can be sent to your provider as well.   To learn more about what you can do with MyChart, go to [email or web address].    Your next appointment:   2 year(s)  The format for your next appointment:   In Person  Provider:   Minus Breeding, MD  Other Instructions

## 2020-04-15 ENCOUNTER — Other Ambulatory Visit: Payer: Self-pay | Admitting: Cardiology

## 2020-06-07 ENCOUNTER — Telehealth: Payer: Self-pay | Admitting: Cardiology

## 2020-06-07 NOTE — Telephone Encounter (Signed)
Agree 

## 2020-06-07 NOTE — Telephone Encounter (Signed)
Pt c/o BP issue: STAT if pt c/o blurred vision, one-sided weakness or slurred speech  1. What are your last 5 BP readings? 120/57, 138/83, 124/65 today  2. Are you having any other symptoms (ex. Dizziness, headache, blurred vision, passed out)? no  3. What is your BP issue? Patient states she has been taking chlorthalidone (HYGROTON) 25 MG tablet and her BP has been fluctuating.

## 2020-06-07 NOTE — Telephone Encounter (Signed)
Reports taking chlorthalidone 25 mg daily.  Reports random BP readings have been 112/57, 138/83 and 124/65 Advised that normal BP is 120/80 and her readings are good Advised to continue monitoring BP and contact our office if she has consistent BP readings that are out of range 130/80 or greater Verbalized understanding.

## 2021-04-01 ENCOUNTER — Other Ambulatory Visit: Payer: Self-pay | Admitting: Adult Health

## 2021-06-22 ENCOUNTER — Telehealth: Payer: Self-pay

## 2021-06-22 NOTE — Telephone Encounter (Signed)
   [facility] Pre-operative Risk Assessment    Patient Name: Donna Kirk  DOB: 1946-02-22 MRN: [id number]  HEARTCARE STAFF:  - IMPORTANT!!!!!! Under Visit Info/Reason for Call, type in Other and utilize the format Clearance MM/DD/YY or Clearance TBD. Do not use dashes or single digits. - Please review there is not already an duplicate clearance open for this procedure. - If request is for dental extraction, please clarify the # of teeth to be extracted. - If the patient is currently at the dentist's office, call Pre-Op Callback Staff (MA/nurse) to input urgent request.  - If the patient is not currently in the dentist office, please route to the Pre-Op pool.  Request for surgical clearance:  What type of surgery is being performed? EGD and colonoscopy  When is this surgery scheduled? 07/04/2021  What type of clearance is required (medical clearance vs. Pharmacy clearance to hold med vs. Both)? Medical   Are there any medications that need to be held prior to surgery and how long? N/A  Practice name and name of physician performing surgery? [facility]; Dr. Saralyn Pilar hung  What is the office phone number? [phone]   7.   What is the office fax number? [phone]   8.   Anesthesia type (None, local, MAC, general) ? Propofol   Orvan July 06/22/2021, 5:14 PM  _________________________________________________________________   (provider comments below)

## 2021-06-23 NOTE — Telephone Encounter (Signed)
I called to s/w the pt though pt's husband said the pt has taken their dog to the vet and she will be back in about 30 minutes or so. I stated that I will call back in  a little while. I stated that I was calling because she is going to need an appt for pre op clearance. I have tentatively have pt scheduled for 06/28/21 @ 3 pm with Terie Purser, NP at [facility] location. [facility] and [facility] locations are full,  trying to not delay the pt's procedure.

## 2021-06-23 NOTE — Telephone Encounter (Signed)
I was able to s/w the pt and informed her that she will need an appt for pre op clearance. Pt has been scheduled to see Laurann Montana, NP at [facility] location 10/19 @ 3 pm. Pt thanked me for the call and the help. I will forward clearance notes to NP for upcoming appt. Will send FYI to requesting office pt has appt.

## 2021-06-23 NOTE — Telephone Encounter (Signed)
Primary Cardiologist:James Hochrein, MD  Chart reviewed as part of pre-operative protocol coverage. Because of Donna Kirk's past medical history and time since last visit, he/she will require a follow-up visit in order to better assess preoperative cardiovascular risk.  Pre-op covering staff: - Please schedule appointment and call patient to inform them. - Please contact requesting surgeon's office via preferred method (i.e, phone, fax) to inform them of need for appointment prior to surgery.  If applicable, this message will also be routed to pharmacy pool and/or primary cardiologist for input on holding anticoagulant/antiplatelet agent as requested below so that this information is available at time of patient's appointment.   Deberah Pelton, NP  06/23/2021, 7:31 AM

## 2021-06-26 ENCOUNTER — Encounter (HOSPITAL_COMMUNITY): Payer: Self-pay

## 2021-06-26 ENCOUNTER — Emergency Department (HOSPITAL_COMMUNITY): Payer: Medicare Other

## 2021-06-26 ENCOUNTER — Emergency Department (HOSPITAL_COMMUNITY)
Admission: EM | Admit: 2021-06-26 | Discharge: 2021-06-27 | Disposition: A | Discharge status: Home / Self Care | Payer: Medicare Other | Attending: Emergency Medicine | Admitting: Emergency Medicine

## 2021-06-26 DIAGNOSIS — R519 Headache, unspecified: Secondary | ICD-10-CM | POA: Diagnosis not present

## 2021-06-26 DIAGNOSIS — R109 Unspecified abdominal pain: Secondary | ICD-10-CM | POA: Insufficient documentation

## 2021-06-26 DIAGNOSIS — Z5321 Procedure and treatment not carried out due to patient leaving prior to being seen by health care provider: Secondary | ICD-10-CM | POA: Insufficient documentation

## 2021-06-26 DIAGNOSIS — R197 Diarrhea, unspecified: Secondary | ICD-10-CM | POA: Diagnosis not present

## 2021-06-26 DIAGNOSIS — R531 Weakness: Secondary | ICD-10-CM | POA: Insufficient documentation

## 2021-06-26 HISTORY — DX: Essential (primary) hypertension: I10

## 2021-06-26 LAB — COMPREHENSIVE METABOLIC PANEL
ALT: 15 U/L (ref 0–44)
AST: 21 U/L (ref 15–41)
Albumin: 4.5 g/dL (ref 3.5–5.0)
Alkaline Phosphatase: 78 U/L (ref 38–126)
Anion gap: 12 (ref 5–15)
BUN: 11 mg/dL (ref 8–23)
CO2: 25 mmol/L (ref 22–32)
Calcium: 9.7 mg/dL (ref 8.9–10.3)
Chloride: 100 mmol/L (ref 98–111)
Creatinine, Ser: 1 mg/dL (ref 0.44–1.00)
GFR, Estimated: 59 mL/min — ABNORMAL LOW (ref >60)
Glucose, Bld: 110 mg/dL — ABNORMAL HIGH (ref 70–99)
Potassium: 3 mmol/L — ABNORMAL LOW (ref 3.5–5.1)
Sodium: 137 mmol/L (ref 135–145)
Total Bilirubin: 2.2 mg/dL — ABNORMAL HIGH (ref 0.3–1.2)
Total Protein: 7.1 g/dL (ref 6.5–8.1)

## 2021-06-26 LAB — PROTIME-INR
INR: 1 (ref 0.8–1.2)
Prothrombin Time: 13.4 seconds (ref 11.4–15.2)

## 2021-06-26 LAB — TYPE AND SCREEN
ABO/RH(D): O NEG
Antibody Screen: NEGATIVE

## 2021-06-26 LAB — CBC WITH DIFFERENTIAL/PLATELET
Abs Immature Granulocytes: 0.04 10*3/uL (ref 0.00–0.07)
Basophils Absolute: 0.1 10*3/uL (ref 0.0–0.1)
Basophils Relative: 1 %
Eosinophils Absolute: 0 10*3/uL (ref 0.0–0.5)
Eosinophils Relative: 0 %
HCT: 48.2 % — ABNORMAL HIGH (ref 36.0–46.0)
Hemoglobin: 16.8 g/dL — ABNORMAL HIGH (ref 12.0–15.0)
Immature Granulocytes: 0 %
Lymphocytes Relative: 17 %
Lymphs Abs: 1.9 10*3/uL (ref 0.7–4.0)
MCH: 30.6 pg (ref 26.0–34.0)
MCHC: 34.9 g/dL (ref 30.0–36.0)
MCV: 87.8 fL (ref 80.0–100.0)
Monocytes Absolute: 0.9 10*3/uL (ref 0.1–1.0)
Monocytes Relative: 8 %
Neutro Abs: 8.3 10*3/uL — ABNORMAL HIGH (ref 1.7–7.7)
Neutrophils Relative %: 74 %
Platelets: 441 10*3/uL — ABNORMAL HIGH (ref 150–400)
RBC: 5.49 MIL/uL — ABNORMAL HIGH (ref 3.87–5.11)
RDW: 13.5 % (ref 11.5–15.5)
WBC: 11.2 10*3/uL — ABNORMAL HIGH (ref 4.0–10.5)
nRBC: 0 % (ref 0.0–0.2)

## 2021-06-26 LAB — LIPASE, BLOOD: Lipase: 38 U/L (ref 11–51)

## 2021-06-26 LAB — APTT: aPTT: 28 seconds (ref 24–36)

## 2021-06-26 NOTE — ED Triage Notes (Signed)
Pt from home with ems, c.o weakness for the past week, with abd pain, headache and dark tarry stool. Seen at PCP, occult blood test was positive.  Pt a.o  BP 164/80   RBBB on EKG

## 2021-06-26 NOTE — ED Provider Notes (Signed)
Emergency Medicine Provider Triage Evaluation Note  Donna Kirk , a 75 y.o. female  was evaluated in triage.  Pt complains of headache, bloody stools, weight loss  Review of Systems  Positive: Headache, bloody stools, weight loss Negative: fever  Physical Exam  BP (!) 149/76 (BP Location: Right Arm)   Pulse 96   Temp 98.9 F (37.2 C) (Oral)   Resp 16   SpO2 95%  Gen:   Awake, no distress   Resp:  Normal effort  MSK:   Moves extremities without difficulty  Other:  Clear speech, no facial droop, 5/5 strength to the bue/ble  Medical Decision Making  Medically screening exam initiated at 7:05 PM.  Appropriate orders placed.  DENISHA HOEL was informed that the remainder of the evaluation will be completed by another provider, this initial triage assessment does not replace that evaluation, and the importance of remaining in the ED until their evaluation is complete.     Bishop Dublin 06/26/21 1905    Hayden Rasmussen, MD 06/27/21 1011

## 2021-06-27 NOTE — ED Notes (Signed)
Pt called multiple times no answer 

## 2021-06-28 ENCOUNTER — Other Ambulatory Visit: Payer: Self-pay

## 2021-06-28 ENCOUNTER — Ambulatory Visit (HOSPITAL_BASED_OUTPATIENT_CLINIC_OR_DEPARTMENT_OTHER): Payer: Medicare Other | Admitting: Family

## 2021-06-28 ENCOUNTER — Encounter (HOSPITAL_BASED_OUTPATIENT_CLINIC_OR_DEPARTMENT_OTHER): Payer: Self-pay | Admitting: Family

## 2021-06-28 VITALS — BP 146/70 | HR 88 | Ht 63.0 in | Wt 153.2 lb

## 2021-06-28 DIAGNOSIS — I10 Essential (primary) hypertension: Secondary | ICD-10-CM | POA: Diagnosis not present

## 2021-06-28 DIAGNOSIS — R011 Cardiac murmur, unspecified: Secondary | ICD-10-CM

## 2021-06-28 DIAGNOSIS — I452 Bifascicular block: Secondary | ICD-10-CM

## 2021-06-28 DIAGNOSIS — Z01818 Encounter for other preprocedural examination: Secondary | ICD-10-CM | POA: Diagnosis not present

## 2021-06-28 NOTE — Progress Notes (Signed)
Office Visit    Patient Name: Donna Kirk Date of Encounter: 06/30/2021  PCP:  [name], [facility]  Cardiologist:  Minus Breeding, MD  Advanced Practice Provider:  No care team member to display Electrophysiologist:  None      Chief Complaint    Donna Kirk is a 75 y.o. female with a hx of AI, hypothyroidism, breast cancer, HTN, HLD presents today for preoperative clearance   Past Medical History    Past Medical History:  Diagnosis Date   Breast cancer ([place])    Hypertension    Hypothyroidism    Past Surgical History:  Procedure Laterality Date   ABDOMINAL HYSTERECTOMY     BREAST CYST EXCISION  07/03/2012   BREAST SURGERY  1993   right mastectomy-nodes removed   COLONOSCOPY     x2   DE QUERVAIN'S RELEASE  2009   right   [place]  2006   left   MASTOPEXY  07/03/2012   Procedure: MASTOPEXY;  Surgeon: Theodoro Kos, DO;  Location: [facility];  Service: Plastics;  Laterality: Left;  left breast mastopexy reduction, left lateral breast excision with liposuction for symmetry   TUBAL LIGATION      Allergies  No Known Allergies  History of Present Illness    Donna Kirk is a 75 y.o. female with a hx of moderate AI, septal hypertrophy, murmur, HTN, HLD, hypothyroidism, breast cancer last seen 04/11/20 by Dr. Percival Spanish.  Last seen 04/11/20 by Dr. Percival Spanish. Previous echo 04/2019 with moderate AI, no AS, some septal hypertrophy. She was recommended for follow up in a couple of years.   Seen in the ED 06/26/21 with headache, bloody stools, weight loss. She was seen at primary care and occult blood test was positive. Preliminary workup 06/26/21 K 3.0, creatinine 1.00, GFR 59, AST 21, ALT 15, wbc 11.2, rbc 5.49, Hb 16.8, Hct 48.2. She left without being evaluated.   She presents today for preop clearance for EGD and colonoscopy. We reviewed preliminary ED findings. Tells me she feels weak,  tired. She is very anxious that her recent weight loss is due to cancer. She continues to have headache which she treated with Tylenol. Notes stress related to her husband's cognitive status. She is very active in her homemaking. Reports no shortness of breath nor dyspnea on exertion. Reports no chest pain, pressure, or tightness. No edema, orthopnea, PND. Reports no palpitations.     EKGs/Labs/Other Studies Reviewed:   The following studies were reviewed today:  CT head 06/26/21 IMPRESSION: 1. No CT evidence for acute intracranial abnormality. 2. Mild atrophy and chronic small vessel ischemic change of the white matter    EKG:  No EKG today. EKG independently reviewed from 06/27/21 demonstrated NSR 100bpm with RBBB and LAFB (bifascicular block). No acute St/T wave changes.   Recent Labs: 06/26/2021: ALT 15; BUN 11; Creatinine, Ser 1.00; Hemoglobin 16.8; Platelets 441; Potassium 3.0; Sodium 137  Recent Lipid Panel No results found for: CHOL, TRIG, HDL, CHOLHDL, VLDL, LDLCALC, LDLDIRECT   Home Medications   Current Meds  Medication Sig   chlorthalidone (HYGROTON) 25 MG tablet TAKE 1 TABLET(25 MG) BY MOUTH DAILY   Cholecalciferol (VITAMIN D3) 50 MCG (2000 UT) TABS Take by mouth.   levothyroxine (SYNTHROID, LEVOTHROID) 100 MCG tablet Take 100 mcg by mouth daily.   naproxen sodium (ALEVE) 220 MG tablet Take 220 mg by mouth.   simvastatin (ZOCOR) 20 MG tablet  Take 1 tablet by mouth daily.     Review of Systems      All other systems reviewed and are otherwise negative except as noted above.  Physical Exam    VS:  BP (!) 146/70 (BP Location: Left Arm, Patient Position: Sitting, Cuff Size: Normal)   Pulse 88   Ht 5\' 3"  (1.6 m)   Wt 153 lb 3.2 oz (69.5 kg)   SpO2 97%   BMI 27.14 kg/m  , BMI Body mass index is 27.14 kg/m.  Wt Readings from Last 3 Encounters:  06/28/21 153 lb 3.2 oz (69.5 kg)  04/11/20 168 lb 3.2 oz (76.3 kg)  01/28/20 177 lb (80.3 kg)     GEN: Well  nourished, well developed, in no acute distress. HEENT: normal. Neck: Supple, no JVD, carotid bruits, or masses. Cardiac: RRR, no  rubs, or gallops. Gr 2/6 systolic murmur. No clubbing, cyanosis, edema.  Radials/PT 2+ and equal bilaterally.  Respiratory:  Respirations regular and unlabored, clear to auscultation bilaterally. GI: Soft, nontender, nondistended. MS: No deformity or atrophy. Skin: Warm and dry, no rash. Neuro:  Strength and sensation are intact. Psych: Normal affect.  Assessment & Plan    Preoperative cardiovascular clearance - Upcoming EGD and colonoscopy with GI 07/04/21. Not on antiplatelet nor anticoagulant. According to the Revised Cardiac Risk Index (RCRI), her Perioperative Risk of Major Cardiac Event is (%): 0.4. Her  Functional Capacity in METs is: 4.64 according to the Duke Activity Status Index (DASI). She is deemed acceptable risk for the planned procedure without additional cardiovascular testing.   Moderate AI / Septal hypertrophy - Noted by echo 04/2019. Gr 2/6 systolic murmur on exam. Will update echocardiogram for monitoring, does not need to be completed prior to EGD/colonoscopy.   HTN - Initial BP 174/90 and improved to 146/70 without intervention in clinic. Endorses lots of stress regarding upcoming procedure, her husband's cognitive status, and is markedly anxious that her recent weight loss is due to cancer. Attempted to reassure her. Encouraged home monitoring. She politely declines medication changes today. She will continue Chlorthalidone 25mg  QD.   HLD - Continue Simvastatin 20mg  QD.  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 Asymptomatic with no near syncope nor syncope. Follow with periodic EKG. Update echo, as above.   Hypokalemia - Noted during recent ED visit. Instructed to take Potassium 26mEq QD x 3 days then return to 110mEq dosing. Recommend repeat BMP with primary care.   Disposition: Follow up in 3  month(s) with Dr. Percival Spanish or APP.  Signed, Loel Dubonnet, NP 06/30/2021, 9:27 PM [place]

## 2021-06-28 NOTE — Patient Instructions (Addendum)
Medication Instructions:  Your physician has recommended you make the following change in your medication:   Take Potassium 2 tablets (40 mEq) daily for 3 days (Thursday, Friday, Saturday).  Then return to Potassium 1 tablet (20 mEq) daily.  *If you need a refill on your cardiac medications before your next appointment, please call your pharmacy*   Lab Work: None ordered today.  Recommend having your potassium rechecked with Dr. Arelia Sneddon in about a week.   Testing/Procedures: Your physician has requested that you have an echocardiogram. Echocardiography is a painless test that uses sound waves to create images of your heart. It provides your doctor with information about the size and shape of your heart and how well your heart's chambers and valves are working. This procedure takes approximately one hour. There are no restrictions for this procedure.   Follow-Up: At [facility], you and your health needs are our priority.  As part of our continuing mission to provide you with exceptional heart care, we have created designated Provider Care Teams.  These Care Teams include your primary Cardiologist (physician) and Advanced Practice Providers (APPs -  Physician Assistants and Nurse Practitioners) who all work together to provide you with the care you need, when you need it.  We recommend signing up for the patient portal called "MyChart".  Sign up information is provided on this After Visit Summary.  MyChart is used to connect with patients for Virtual Visits (Telemedicine).  Patients are able to view lab/test results, encounter notes, upcoming appointments, etc.  Non-urgent messages can be sent to your provider as well.   To learn more about what you can do with MyChart, go to [email or web address].    Your next appointment:   3 month(s) to discuss your echocardiogram and follow up on blood pressure  The format for your next appointment:   In Person  Provider:   You may see Minus Breeding, MD or one of the following Advanced Practice Providers on your designated Care Team:   Rosaria Ferries, PA-C Caron Presume, PA-C Jory Sims, DNP, ANP Loel Dubonnet, NP    Other Instructions  Loel Dubonnet, NP will send a note to GI that you may have your procedure.  Please keep a log of your blood pressure readings and bring to your next clinic visit.

## 2021-06-29 ENCOUNTER — Telehealth (HOSPITAL_COMMUNITY): Payer: Self-pay | Admitting: Family

## 2021-06-29 NOTE — Telephone Encounter (Signed)
I called patient to schedule echocardiogram that you ordered. Patient has a lot going on and pending some serious test results and states this is on the bottom of her list at this moment. She will call us back to reschedule if she decides to have. Order will be removed from the active echo wq. If patient calls back to reschedule we will reinstate the order. Thank you.

## 2021-06-30 ENCOUNTER — Encounter (HOSPITAL_BASED_OUTPATIENT_CLINIC_OR_DEPARTMENT_OTHER): Payer: Self-pay | Admitting: Family

## 2021-06-30 NOTE — Telephone Encounter (Signed)
Noted.   Donna Kirk S Keyondre Hepburn, NP  

## 2021-08-02 ENCOUNTER — Encounter (HOSPITAL_COMMUNITY): Payer: Self-pay | Admitting: Emergency Medicine

## 2021-08-02 ENCOUNTER — Emergency Department (HOSPITAL_COMMUNITY): Payer: Medicare Other

## 2021-08-02 ENCOUNTER — Other Ambulatory Visit: Payer: Self-pay

## 2021-08-02 ENCOUNTER — Emergency Department (HOSPITAL_COMMUNITY)
Admission: EM | Admit: 2021-08-02 | Discharge: 2021-08-04 | Disposition: A | Discharge status: Other Acute Inpatient Hospital | Payer: Medicare Other | Attending: Emergency Medicine | Admitting: Emergency Medicine

## 2021-08-02 DIAGNOSIS — R109 Unspecified abdominal pain: Secondary | ICD-10-CM | POA: Insufficient documentation

## 2021-08-02 DIAGNOSIS — E039 Hypothyroidism, unspecified: Secondary | ICD-10-CM | POA: Insufficient documentation

## 2021-08-02 DIAGNOSIS — R4182 Altered mental status, unspecified: Secondary | ICD-10-CM | POA: Insufficient documentation

## 2021-08-02 DIAGNOSIS — S80812A Abrasion, left lower leg, initial encounter: Secondary | ICD-10-CM | POA: Insufficient documentation

## 2021-08-02 DIAGNOSIS — I10 Essential (primary) hypertension: Secondary | ICD-10-CM | POA: Diagnosis not present

## 2021-08-02 DIAGNOSIS — S80811A Abrasion, right lower leg, initial encounter: Secondary | ICD-10-CM | POA: Insufficient documentation

## 2021-08-02 DIAGNOSIS — X31XXXA Exposure to excessive natural cold, initial encounter: Secondary | ICD-10-CM | POA: Insufficient documentation

## 2021-08-02 DIAGNOSIS — Z79899 Other long term (current) drug therapy: Secondary | ICD-10-CM | POA: Insufficient documentation

## 2021-08-02 DIAGNOSIS — R0689 Other abnormalities of breathing: Secondary | ICD-10-CM | POA: Insufficient documentation

## 2021-08-02 DIAGNOSIS — Z853 Personal history of malignant neoplasm of breast: Secondary | ICD-10-CM | POA: Insufficient documentation

## 2021-08-02 DIAGNOSIS — Z20822 Contact with and (suspected) exposure to covid-19: Secondary | ICD-10-CM | POA: Diagnosis not present

## 2021-08-02 DIAGNOSIS — F329 Major depressive disorder, single episode, unspecified: Secondary | ICD-10-CM | POA: Diagnosis not present

## 2021-08-02 DIAGNOSIS — F29 Unspecified psychosis not due to a substance or known physiological condition: Secondary | ICD-10-CM | POA: Diagnosis not present

## 2021-08-02 DIAGNOSIS — T699XXA Effect of reduced temperature, unspecified, initial encounter: Secondary | ICD-10-CM

## 2021-08-02 DIAGNOSIS — S8991XA Unspecified injury of right lower leg, initial encounter: Secondary | ICD-10-CM | POA: Diagnosis present

## 2021-08-02 LAB — CBC WITH DIFFERENTIAL/PLATELET
Abs Immature Granulocytes: 0.11 10*3/uL — ABNORMAL HIGH (ref 0.00–0.07)
Basophils Absolute: 0.1 10*3/uL (ref 0.0–0.1)
Basophils Relative: 0 %
Eosinophils Absolute: 0 10*3/uL (ref 0.0–0.5)
Eosinophils Relative: 0 %
HCT: 52.5 % — ABNORMAL HIGH (ref 36.0–46.0)
Hemoglobin: 18.6 g/dL — ABNORMAL HIGH (ref 12.0–15.0)
Immature Granulocytes: 1 %
Lymphocytes Relative: 6 %
Lymphs Abs: 1 10*3/uL (ref 0.7–4.0)
MCH: 30.6 pg (ref 26.0–34.0)
MCHC: 35.4 g/dL (ref 30.0–36.0)
MCV: 86.3 fL (ref 80.0–100.0)
Monocytes Absolute: 1.2 10*3/uL — ABNORMAL HIGH (ref 0.1–1.0)
Monocytes Relative: 7 %
Neutro Abs: 13.6 10*3/uL — ABNORMAL HIGH (ref 1.7–7.7)
Neutrophils Relative %: 86 %
Platelets: 498 10*3/uL — ABNORMAL HIGH (ref 150–400)
RBC: 6.08 MIL/uL — ABNORMAL HIGH (ref 3.87–5.11)
RDW: 14.1 % (ref 11.5–15.5)
WBC: 15.9 10*3/uL — ABNORMAL HIGH (ref 4.0–10.5)
nRBC: 0 % (ref 0.0–0.2)

## 2021-08-02 LAB — COMPREHENSIVE METABOLIC PANEL
ALT: 22 U/L (ref 0–44)
AST: 29 U/L (ref 15–41)
Albumin: 4.9 g/dL (ref 3.5–5.0)
Alkaline Phosphatase: 104 U/L (ref 38–126)
Anion gap: 16 — ABNORMAL HIGH (ref 5–15)
BUN: 20 mg/dL (ref 8–23)
CO2: 28 mmol/L (ref 22–32)
Calcium: 10.1 mg/dL (ref 8.9–10.3)
Chloride: 93 mmol/L — ABNORMAL LOW (ref 98–111)
Creatinine, Ser: 0.8 mg/dL (ref 0.44–1.00)
GFR, Estimated: >60 mL/min (ref >60)
Glucose, Bld: 119 mg/dL — ABNORMAL HIGH (ref 70–99)
Potassium: 2.8 mmol/L — ABNORMAL LOW (ref 3.5–5.1)
Sodium: 137 mmol/L (ref 135–145)
Total Bilirubin: 3.5 mg/dL — ABNORMAL HIGH (ref 0.3–1.2)
Total Protein: 7.8 g/dL (ref 6.5–8.1)

## 2021-08-02 LAB — URINALYSIS, ROUTINE W REFLEX MICROSCOPIC
Bacteria, UA: NONE SEEN
Bilirubin Urine: NEGATIVE
Glucose, UA: NEGATIVE mg/dL
Hgb urine dipstick: NEGATIVE
Ketones, ur: 20 mg/dL — AB
Leukocytes,Ua: NEGATIVE
Nitrite: NEGATIVE
Protein, ur: 30 mg/dL — AB
Specific Gravity, Urine: 1.021 (ref 1.005–1.030)
pH: 5 (ref 5.0–8.0)

## 2021-08-02 LAB — LACTIC ACID, PLASMA: Lactic Acid, Venous: 1.8 mmol/L (ref 0.5–1.9)

## 2021-08-02 LAB — TROPONIN I (HIGH SENSITIVITY)
Troponin I (High Sensitivity): 4 ng/L (ref <18)
Troponin I (High Sensitivity): 5 ng/L (ref <18)

## 2021-08-02 LAB — MAGNESIUM: Magnesium: 1.9 mg/dL (ref 1.7–2.4)

## 2021-08-02 LAB — SALICYLATE LEVEL: Salicylate Lvl: <7 mg/dL — ABNORMAL LOW (ref 7.0–30.0)

## 2021-08-02 LAB — RESP PANEL BY RT-PCR (FLU A&B, COVID) ARPGX2
Influenza A by PCR: NEGATIVE
Influenza B by PCR: NEGATIVE
SARS Coronavirus 2 by RT PCR: NEGATIVE

## 2021-08-02 LAB — RAPID URINE DRUG SCREEN, HOSP PERFORMED
Amphetamines: NOT DETECTED
Barbiturates: NOT DETECTED
Benzodiazepines: NOT DETECTED
Cocaine: NOT DETECTED
Opiates: NOT DETECTED
Tetrahydrocannabinol: NOT DETECTED

## 2021-08-02 LAB — ETHANOL: Alcohol, Ethyl (B): <10 mg/dL (ref <10)

## 2021-08-02 LAB — ACETAMINOPHEN LEVEL: Acetaminophen (Tylenol), Serum: <10 ug/mL — ABNORMAL LOW (ref 10–30)

## 2021-08-02 MED ORDER — LORAZEPAM 2 MG/ML IJ SOLN
0.5000 mg | Freq: Once | INTRAMUSCULAR | Status: AC
  Administered 2021-08-02: 0.5 mg via INTRAVENOUS
  Filled 2021-08-02: qty 1

## 2021-08-02 MED ORDER — LACTATED RINGERS IV BOLUS
1000.0000 mL | Freq: Once | INTRAVENOUS | Status: AC
  Administered 2021-08-02: 1000 mL via INTRAVENOUS

## 2021-08-02 MED ORDER — SIMVASTATIN 20 MG PO TABS
20.0000 mg | ORAL_TABLET | Freq: Every day | ORAL | Status: DC
  Administered 2021-08-02 – 2021-08-03 (×2): 20 mg via ORAL
  Filled 2021-08-02 (×2): qty 1

## 2021-08-02 MED ORDER — IOHEXOL 350 MG/ML SOLN
80.0000 mL | Freq: Once | INTRAVENOUS | Status: AC | PRN
  Administered 2021-08-02: 80 mL via INTRAVENOUS

## 2021-08-02 MED ORDER — LEVOTHYROXINE SODIUM 88 MCG PO TABS
88.0000 ug | ORAL_TABLET | Freq: Every morning | ORAL | Status: DC
  Administered 2021-08-03: 88 ug via ORAL
  Filled 2021-08-02: qty 1

## 2021-08-02 MED ORDER — VITAMIN D 25 MCG (1000 UNIT) PO TABS
2000.0000 [IU] | ORAL_TABLET | Freq: Every day | ORAL | Status: DC
  Administered 2021-08-02 – 2021-08-03 (×2): 2000 [IU] via ORAL
  Filled 2021-08-02 (×2): qty 2

## 2021-08-02 MED ORDER — CITALOPRAM HYDROBROMIDE 10 MG PO TABS
20.0000 mg | ORAL_TABLET | Freq: Every day | ORAL | Status: DC
  Administered 2021-08-02 – 2021-08-03 (×2): 20 mg via ORAL
  Filled 2021-08-02 (×2): qty 2

## 2021-08-02 MED ORDER — POTASSIUM CHLORIDE 20 MEQ PO PACK
40.0000 meq | PACK | Freq: Two times a day (BID) | ORAL | Status: DC
  Administered 2021-08-02 – 2021-08-03 (×3): 40 meq via ORAL
  Filled 2021-08-02 (×3): qty 2

## 2021-08-02 MED ORDER — MAGNESIUM OXIDE -MG SUPPLEMENT 400 (240 MG) MG PO TABS
400.0000 mg | ORAL_TABLET | Freq: Once | ORAL | Status: AC
  Administered 2021-08-02: 400 mg via ORAL
  Filled 2021-08-02: qty 1

## 2021-08-02 MED ORDER — POTASSIUM CHLORIDE CRYS ER 20 MEQ PO TBCR: 20.0000 meq | EXTENDED_RELEASE_TABLET | ORAL | Status: DC

## 2021-08-02 MED ORDER — CHLORTHALIDONE 25 MG PO TABS
25.0000 mg | ORAL_TABLET | Freq: Every day | ORAL | Status: DC
  Administered 2021-08-02 – 2021-08-03 (×2): 25 mg via ORAL
  Filled 2021-08-02 (×3): qty 1

## 2021-08-02 NOTE — Progress Notes (Signed)
Inpatient [place] Placement  Pt meets inpatient criteria per Lindon Romp, NP. There are no appropriate beds at [facility] per Yoakum Oluwatosin Olasunkanmi, RN.   CSW sent a secure chat to the physician and clinical staff about reviewing pt's [facility] physician and clinical staff: Dr. Alethia Berthold, MD, Ian Malkin, Counselor, [place], LCAS, and covering first shift CSW [place], [place] to follow up with referral and check the availability at [facility].  Referral was sent to the following facilities:  Destination Service Provider Address Phone Fax  [facility]  [address] [place] [address] [phone]  [facility]  [address], [place] [address] [phone] [facility]  [address] [place] [address] [phone] [facility]  [place], [place] [address] [phone]  [facility]  [address] [place] [address] [phone] [facility]  [address] [place] [address] [phone] [facility]  [address], [place] [address] [phone]  [facility]  [address], [place] [address] [phone]  [facility]  [address], [place] [address] [phone] [place], [place] [address] [phone] [facility]  [place], [place] [address] [phone] [facility] [address] [place] [address] [phone] [facility]  [address] [place] [address] [phone]  [facility]  [address] [place] [address] [phone] [facility]  [address], [place] [address] [phone] [address] [place] [address] [phone]   Situation ongoing,  CSW will follow up.   Benjaman Kindler, MSW, [facility] 08/02/2021  @ 11:04 PM

## 2021-08-02 NOTE — ED Provider Notes (Signed)
Patient signed to me by Dr. Doren Custard.  Please see his note for full details.  Patient developed tachycardia while here likely from anxiety as well as dehydration.  Given IV fluids along with Ativan and heart rate is greatly improved.  Upon walking to the patient's room, patient's heart rate was 102.  When she was speaking to me, patient started to go up to 112.  Suspect this is anxiety related.  X-rays and labs are reassuring.  Patient's hypokalemia treated with oral potassium.  Will consult psychiatry for inpatient placement due to suicidal ideations.  Patient placed under IVC by Dr. Johnna Acosta, MD 08/02/21 [phone]

## 2021-08-02 NOTE — BH Assessment (Addendum)
Comprehensive Clinical Assessment (CCA) Note  08/02/2021 Donna Kirk [id number]  Disposition: Lindon Romp, NP, patient meets inpatient treatment. Geropsychiatry recommended. Disposition SW to secure placement. Caryl Comes, RN, informed of disposition.  The patient demonstrates the following risk factors for suicide: Chronic risk factors for suicide include: N/A. Acute risk factors for suicide include: family or marital conflict and loss (financial, interpersonal, professional). Protective factors for this patient include: positive social support and responsibility to others (children, family). Considering these factors, the overall suicide risk at this point appears to be high. Patient is not appropriate for outpatient follow up.  Donna Kirk is a 75 year old female presenting to [facility] due to [place]. Patient denied SI, HI, psychosis and alcohol/drug usage. Per EMS pt had multiple stressors at home and reported she was "done" and left her home. Pt has since then been outside for 2 days in the cold. EMS reports there was a missing report filed on the pt yesterday. Unknown hx of dementia per triage report. Patient denied SI and going into woods to die. Patient stated "I went outside to access everything, they were worried because I was outside". When asked how long was patient outside, patient stated "I stayed away for 1 day, 1 night and part of another day". When asked, did you want to end your life, patient stated "no". Patient denied being depressed. Patient denied prior psych hospitalizations, suicide attempts and self-harming behaviors. Patient denied receiving any outpatient mental health services. Patient denied being prescribed and psych medications. Patient reported living with her husband and 20 year old son. Patient denied family discord. Patient denied access to guns. Patient was calm and cooperative during assessment.   Chief Complaint:  Chief Complaint  Patient presents with   Cold  Exposure   Visit Diagnosis:  Major depressive disorder   CCA Biopsychosocial Patient Reported Schizophrenia/Schizoaffective Diagnosis in Past: No data recorded  Strengths: uta  Mental Health Symptoms Depression:   None   Duration of Depressive symptoms:    Mania:   None   Anxiety:    None   Psychosis:   None   Duration of Psychotic symptoms:    Trauma:   None   Obsessions:   None   Compulsions:   None   Inattention:   None   Hyperactivity/Impulsivity:   None   Oppositional/Defiant Behaviors:   None   Emotional Irregularity:   None   Other Mood/Personality Symptoms:  No data recorded   Mental Status Exam Appearance and self-care  Stature:   Average   Weight:   Average weight   Clothing:   Casual   Grooming:   Normal   Cosmetic use:   None   Posture/gait:   Normal   Motor activity:   Not Remarkable   Sensorium  Attention:   Normal   Concentration:   Normal   Orientation:   X5   Recall/memory:   Normal   Affect and Mood  Affect:   Flat; Depressed   Mood:   Depressed   Relating  Eye contact:   Normal   Facial expression:   Sad; Depressed   Attitude toward examiner:   Guarded   Thought and Language  Speech flow:  Normal   Thought content:   Appropriate to Mood and Circumstances   Preoccupation:   None   Hallucinations:   None   Organization:  No data recorded  [employer] of Knowledge:   Average   Intelligence:   Average   Abstraction:  Normal   Judgement:   Impaired   Reality Testing:   Adequate Pincus Badder)   Insight:   Lacking   Decision Making:   Confused   Social Functioning  Social Maturity:   Impulsive   Social Judgement:   Heedless; Naive   Stress  Stressors:   Financial   Coping Ability:   Exhausted; Overwhelmed   Skill Deficits:   Decision making; Self-control   Supports:   Family    Religion: Religion/Spirituality Are You A Religious Person?:   Special educational needs teacher)  Leisure/Recreation: Leisure / Recreation Do You Have Hobbies?: Yes Leisure and Hobbies: reading, watching tv and walking dog  Exercise/Diet: Exercise/Diet Do You Exercise?: Yes What Type of Exercise Do You Do?: Run/Walk How Many Times a Week Do You Exercise?: 6-7 times a week Have You Gained or Lost A Significant Amount of Weight in the Past Six Months?:  (uta) Do You Have Any Trouble Sleeping?: No  CCA Employment/Education Employment/Work Situation: Employment / Work Technical sales engineer: Retired Has Patient ever [place] in 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)  Education: Education Is Patient Currently Attending School?: No Last Grade Completed: 58 Did [place]?: No Did You Have An Individualized Education Program (IIEP):  (uta) Did You Have Any Difficulty At School?:  (uta) Patient's Education Has Been Impacted by Current Illness:  (uta)  CCA Family/Childhood History Family and Relationship History: Family history Marital status: Married Number of Years Married:  Special educational needs teacher) What types of issues is patient dealing with in the relationship?: unknown Additional relationship information: unknown Does patient have children?: Yes How many children?:  (uta) How is patient's relationship with their children?: good, has at least 1 son  Childhood History:  Childhood History By whom was/is the patient raised?: Both parents Did patient suffer any verbal/emotional/physical/sexual abuse as a child?: No Did patient suffer from severe childhood neglect?: No Has patient ever been sexually abused/assaulted/raped as an adolescent or adult?: No Was the patient ever a victim of a crime or a disaster?: No  Child/Adolescent Assessment:   CCA Substance Use Alcohol/Drug Use: Alcohol / Drug Use Pain Medications: see MAR Prescriptions: see MAR Over the Counter: see MAR History of alcohol / drug use?: No history of alcohol / drug abuse   ASAM's:  Six Dimensions of  Multidimensional Assessment  Dimension 1:  Acute Intoxication and/or Withdrawal Potential:      Dimension 2:  Biomedical Conditions and Complications:      Dimension 3:  Emotional, Behavioral, or Cognitive Conditions and Complications:     Dimension 4:  Readiness to Change:     Dimension 5:  Relapse, Continued use, or Continued Problem Potential:     Dimension 6:  Recovery/Living Environment:     ASAM Severity Score:    ASAM Recommended Level of Treatment:     Substance use Disorder (SUD)   Recommendations for Services/Supports/Treatments: Recommendations for Services/Supports/Treatments Recommendations For Services/Supports/Treatments: Individual Therapy, Medication Management, Inpatient Hospitalization  Discharge Disposition:   DSM5 Diagnoses: Patient Active Problem List   Diagnosis Date Noted   RBBB 04/10/2020   Hypertrophic cardiomyopathy ([place]) 04/10/2020   HTN (hypertension) 06/22/2014   Abnormal CXR 06/22/2014   Abnormal EKG 06/22/2014   Postoperative breast asymmetry 07/03/2012   Referrals to Alternative Service(s): Referred to Alternative Service(s):   Place:   Date:   Time:    Referred to Alternative Service(s):   Place:   Date:   Time:    Referred to Alternative Service(s):   Place:   Date:   Time:  Referred to Alternative Service(s):   Place:   Date:   Time:     Venora Maples, [facility]

## 2021-08-02 NOTE — ED Notes (Signed)
Pt has purewick on.

## 2021-08-02 NOTE — ED Provider Notes (Signed)
[facility] DEPT Provider Note   CSN: [id number] Arrival date & time: 08/02/21  1200     History Chief Complaint  Patient presents with   Cold Exposure    Donna Kirk is a 75 y.o. female.  HPI Patient presents for suicide attempt.  She has had increasing stressors at home, mostly regarding financial status.  2 days ago, she left her home to go out in the woods to die.  She was found this morning, outside in the cold.  She was covered in leaves and dirt.  EMS was called.  She currently denies any areas of discomfort.  She denies any acts of self-harm or any ingestions.  She continues to endorse suicidal ideation.    Past Medical History:  Diagnosis Date   Breast cancer ([place])    Hypertension    Hypothyroidism     Patient Active Problem List   Diagnosis Date Noted   RBBB 04/10/2020   Hypertrophic cardiomyopathy ([place]) 04/10/2020   HTN (hypertension) 06/22/2014   Abnormal CXR 06/22/2014   Abnormal EKG 06/22/2014   Postoperative breast asymmetry 07/03/2012    Past Surgical History:  Procedure Laterality Date   ABDOMINAL HYSTERECTOMY     BREAST CYST EXCISION  07/03/2012   BREAST SURGERY  1993   right mastectomy-nodes removed   COLONOSCOPY     x2   DE QUERVAIN'S RELEASE  2009   right   [place]  2006   left   MASTOPEXY  07/03/2012   Procedure: MASTOPEXY;  Surgeon: Theodoro Kos, DO;  Location: [facility];  Service: Plastics;  Laterality: Left;  left breast mastopexy reduction, left lateral breast excision with liposuction for symmetry   TUBAL LIGATION       OB History   No obstetric history on file.     Family History  Problem Relation Age of Onset   CAD Father     Social History   Tobacco Use   Smoking status: Never   Smokeless tobacco: Never  Substance Use Topics   Alcohol use: No   Drug use: No    Home Medications Prior to Admission medications   Medication Sig Start Date End Date  Taking? Authorizing Provider  acetaminophen (TYLENOL) 650 MG CR tablet Take 650 mg by mouth every 8 (eight) hours as needed for pain.   Yes [provider]  Azelaic Acid 15 % gel 1 application daily as needed (for face). 03/27/21  Yes [provider]  chlorthalidone (HYGROTON) 25 MG tablet TAKE 1 TABLET(25 MG) BY MOUTH DAILY Patient taking differently: Take 25 mg by mouth daily. 04/04/21  Yes Minus Breeding, MD  Cholecalciferol (VITAMIN D3) 50 MCG (2000 UT) TABS Take 2,000 Units by mouth daily.   Yes [provider]  citalopram (CELEXA) 20 MG tablet Take 20 mg by mouth daily. 07/07/21  Yes [provider]  levothyroxine (SYNTHROID) 88 MCG tablet Take 88 mcg by mouth every morning. 06/30/21  Yes [provider]  potassium chloride SA (KLOR-CON) 20 MEQ tablet Take 20-40 mEq by mouth See admin instructions. Takes 40 mg on Thursday, Friday and Saturday and 20 mg on all other days 06/13/21  Yes [provider]  simvastatin (ZOCOR) 20 MG tablet Take 20 mg by mouth daily. 06/05/15  Yes [provider]    Allergies    Patient has no known allergies.  Review of Systems   Review of Systems  Constitutional:  Negative for chills, fatigue and fever.  HENT:  Negative for congestion, ear pain and sore throat.   Eyes:  Negative for pain and visual disturbance.  Respiratory:  Negative for cough, chest tightness and shortness of breath.   Cardiovascular:  Negative for chest pain and palpitations.  Gastrointestinal:  Negative for abdominal pain, diarrhea, nausea and vomiting.  Genitourinary:  Negative for dysuria, flank pain, frequency, hematuria and pelvic pain.  Musculoskeletal:  Negative for arthralgias, back pain, gait problem, joint swelling, myalgias and neck pain.  Skin:  Negative for color change, pallor and rash.  Neurological:  Negative for dizziness, seizures, syncope, weakness, light-headedness, numbness and headaches.   Psychiatric/Behavioral:  Positive for dysphoric mood and suicidal ideas.   All other systems reviewed and are negative.  Physical Exam Updated Vital Signs BP 125/60   Pulse 68   Temp 99.7 F (37.6 C) (Rectal)   Resp 16   Ht 5\' 3"  (1.6 m)   Wt 69.5 kg   SpO2 100%   BMI 27.14 kg/m   Physical Exam Vitals and nursing note reviewed.  Constitutional:      General: She is not in acute distress.    Appearance: She is well-developed and normal weight. She is not ill-appearing, toxic-appearing or diaphoretic.  HENT:     Head: Normocephalic and atraumatic.     Left Ear: External ear normal.     Nose: Nose normal.     Mouth/Throat:     Mouth: Mucous membranes are moist.     Pharynx: Oropharynx is clear.  Eyes:     General: No scleral icterus.    Extraocular Movements: Extraocular movements intact.     Conjunctiva/sclera: Conjunctivae normal.  Cardiovascular:     Rate and Rhythm: Normal rate and regular rhythm.     Heart sounds: No murmur heard. Pulmonary:     Effort: Pulmonary effort is normal. No respiratory distress.     Breath sounds: Normal breath sounds. No wheezing or rales.  Chest:     Chest wall: No tenderness.  Abdominal:     Palpations: Abdomen is soft.     Tenderness: There is no abdominal tenderness. There is no right CVA tenderness or left CVA tenderness.  Musculoskeletal:        General: No swelling, tenderness or deformity.     Cervical back: Normal range of motion and neck supple. No rigidity.  Skin:    General: Skin is warm and dry.     Capillary Refill: Capillary refill takes less than 2 seconds.     Comments: Superficial scrapes to bilateral lower extremities  Neurological:     General: No focal deficit present.     Mental Status: She is alert and oriented to person, place, and time.     Cranial Nerves: No cranial nerve deficit.     Sensory: No sensory deficit.     Motor: No weakness.     Coordination: Coordination normal.  Psychiatric:        Mood  and Affect: Mood is depressed. Affect is flat.        Speech: Speech normal. She is communicative. Speech is not delayed, slurred or tangential.        Behavior: Behavior is withdrawn. Behavior is not agitated, aggressive or hyperactive.        Thought Content: Thought content includes suicidal ideation. Thought content includes suicidal plan.    ED Results / Procedures / Treatments   Labs (all labs ordered are listed, but only abnormal results are displayed) Labs Reviewed  COMPREHENSIVE METABOLIC  PANEL - Abnormal; Notable for the following components:      Result Value   Potassium 2.8 (*)    Chloride 93 (*)    Glucose, Bld 119 (*)    Total Bilirubin 3.5 (*)    Anion gap 16 (*)    All other components within normal limits  CBC WITH DIFFERENTIAL/PLATELET - Abnormal; Notable for the following components:   WBC 15.9 (*)    RBC 6.08 (*)    Hemoglobin 18.6 (*)    HCT 52.5 (*)    Platelets 498 (*)    Neutro Abs 13.6 (*)    Monocytes Absolute 1.2 (*)    Abs Immature Granulocytes 0.11 (*)    All other components within normal limits  SALICYLATE LEVEL - Abnormal; Notable for the following components:   Salicylate Lvl <6.5 (*)    All other components within normal limits  ACETAMINOPHEN LEVEL - Abnormal; Notable for the following components:   Acetaminophen (Tylenol), Serum <10 (*)    All other components within normal limits  URINALYSIS, ROUTINE W REFLEX MICROSCOPIC - Abnormal; Notable for the following components:   Color, Urine AMBER (*)    APPearance HAZY (*)    Ketones, ur 20 (*)    Protein, ur 30 (*)    All other components within normal limits  RESP PANEL BY RT-PCR (FLU A&B, COVID) ARPGX2  CULTURE, BLOOD (ROUTINE X 2)  CULTURE, BLOOD (ROUTINE X 2)  ETHANOL  RAPID URINE DRUG SCREEN, HOSP PERFORMED  MAGNESIUM  LACTIC ACID, PLASMA  TROPONIN I (HIGH SENSITIVITY)  TROPONIN I (HIGH SENSITIVITY)    EKG EKG Interpretation  Date/Time:  Wednesday August 02 2021 12:24:46  EST Ventricular Rate:  99 PR Interval:  130 QRS Duration: 137 QT Interval:  384 QTC Calculation: 493 R Axis:   -44 Text Interpretation: Sinus rhythm Right bundle branch block Confirmed by Godfrey Pick ([id number]) on 08/02/2021 12:29:14 PM  Radiology CT Head Wo Contrast  Result Date: 08/02/2021 CLINICAL DATA:  Mental status change, unknown cause EXAM: CT HEAD WITHOUT CONTRAST TECHNIQUE: Contiguous axial images were obtained from the base of the skull through the vertex without intravenous contrast. COMPARISON:  CT head 06/26/2021 BRAIN: BRAIN Cerebral ventricle sizes are concordant with the degree of cerebral volume loss. Patchy and confluent areas of decreased attenuation are noted throughout the deep and periventricular white matter of the cerebral hemispheres bilaterally, compatible with chronic microvascular ischemic disease. No evidence of large-territorial acute infarction. No parenchymal hemorrhage. No mass lesion. No extra-axial collection. No mass effect or midline shift. No hydrocephalus. Basilar cisterns are patent. Vascular: No hyperdense vessel. Skull: No acute fracture or focal lesion. Sinuses/Orbits: Paranasal sinuses and mastoid air cells are clear. The orbits are unremarkable. Other: None. IMPRESSION: No acute intracranial abnormality. Electronically Signed   By: Iven Finn M.D.   On: 08/02/2021 18:03   CT Angio Chest PE W and/or Wo Contrast  Result Date: 08/02/2021 CLINICAL DATA:  Abdominal pain, fever, cold exposure EXAM: CT ANGIOGRAPHY CHEST CT ABDOMEN AND PELVIS WITH CONTRAST TECHNIQUE: Multidetector CT imaging of the chest was performed using the standard protocol during bolus administration of intravenous contrast. Multiplanar CT image reconstructions and MIPs were obtained to evaluate the vascular anatomy. Multidetector CT imaging of the abdomen and pelvis was performed using the standard protocol during bolus administration of intravenous contrast. CONTRAST:  54mL OMNIPAQUE  IOHEXOL 350 MG/ML SOLN COMPARISON:  09/15/2019 FINDINGS: CTA CHEST FINDINGS Cardiovascular: This is a technically adequate evaluation of the pulmonary vasculature. No filling  defects or pulmonary emboli. The heart is unremarkable without pericardial effusion. No evidence of thoracic aortic aneurysm or dissection. Mild atherosclerosis of the aorta and coronary vasculature. Mediastinum/Nodes: No enlarged mediastinal, hilar, or axillary lymph nodes. Thyroid gland, trachea, and esophagus demonstrate no significant findings. Lungs/Pleura: No acute airspace disease, effusion, or pneumothorax. Central airways are patent. Musculoskeletal: Postsurgical changes from right mastectomy. No acute or destructive bony lesions. Reconstructed images demonstrate no additional findings. Review of the MIP images confirms the above findings. CT ABDOMEN and PELVIS FINDINGS Hepatobiliary: Gallbladder is decompressed, with a small calcified gallstone in the gallbladder fundus. No evidence of acute cholecystitis. Liver is unremarkable. Pancreas: Unremarkable. No pancreatic ductal dilatation or surrounding inflammatory changes. Spleen: Normal in size without focal abnormality. Adrenals/Urinary Tract: 3 mm nonobstructing calculus upper pole left kidney. No right-sided calculi. Large simple cyst exophytic off the upper pole right kidney. Scattered areas of bilateral renal cortical scarring. No hydronephrosis. The adrenals and bladder are unremarkable. Stomach/Bowel: No bowel obstruction or ileus. Normal appendix right upper quadrant. Minimal sigmoid diverticulosis with no evidence of acute diverticulitis. No bowel wall thickening or inflammatory change. Vascular/Lymphatic: Aortic atherosclerosis. No enlarged abdominal or pelvic lymph nodes. Reproductive: Status post hysterectomy. No adnexal masses. Other: No free fluid or free gas.  No abdominal wall hernia. Musculoskeletal: No acute or destructive bony lesions. Reconstructed images demonstrate  no additional findings. Review of the MIP images confirms the above findings. IMPRESSION: 1. No evidence of pulmonary embolus. 2. No acute intrathoracic process. 3. Cholelithiasis without cholecystitis. 4. Sigmoid diverticulosis without diverticulitis. 5. Nonobstructing 3 mm left renal calculus. 6.  Aortic Atherosclerosis (ICD10-I70.0). Electronically Signed   By: Randa Ngo M.D.   On: 08/02/2021 18:08   CT ABDOMEN PELVIS W CONTRAST  Result Date: 08/02/2021 CLINICAL DATA:  Abdominal pain, fever, cold exposure EXAM: CT ANGIOGRAPHY CHEST CT ABDOMEN AND PELVIS WITH CONTRAST TECHNIQUE: Multidetector CT imaging of the chest was performed using the standard protocol during bolus administration of intravenous contrast. Multiplanar CT image reconstructions and MIPs were obtained to evaluate the vascular anatomy. Multidetector CT imaging of the abdomen and pelvis was performed using the standard protocol during bolus administration of intravenous contrast. CONTRAST:  49mL OMNIPAQUE IOHEXOL 350 MG/ML SOLN COMPARISON:  09/15/2019 FINDINGS: CTA CHEST FINDINGS Cardiovascular: This is a technically adequate evaluation of the pulmonary vasculature. No filling defects or pulmonary emboli. The heart is unremarkable without pericardial effusion. No evidence of thoracic aortic aneurysm or dissection. Mild atherosclerosis of the aorta and coronary vasculature. Mediastinum/Nodes: No enlarged mediastinal, hilar, or axillary lymph nodes. Thyroid gland, trachea, and esophagus demonstrate no significant findings. Lungs/Pleura: No acute airspace disease, effusion, or pneumothorax. Central airways are patent. Musculoskeletal: Postsurgical changes from right mastectomy. No acute or destructive bony lesions. Reconstructed images demonstrate no additional findings. Review of the MIP images confirms the above findings. CT ABDOMEN and PELVIS FINDINGS Hepatobiliary: Gallbladder is decompressed, with a small calcified gallstone in the  gallbladder fundus. No evidence of acute cholecystitis. Liver is unremarkable. Pancreas: Unremarkable. No pancreatic ductal dilatation or surrounding inflammatory changes. Spleen: Normal in size without focal abnormality. Adrenals/Urinary Tract: 3 mm nonobstructing calculus upper pole left kidney. No right-sided calculi. Large simple cyst exophytic off the upper pole right kidney. Scattered areas of bilateral renal cortical scarring. No hydronephrosis. The adrenals and bladder are unremarkable. Stomach/Bowel: No bowel obstruction or ileus. Normal appendix right upper quadrant. Minimal sigmoid diverticulosis with no evidence of acute diverticulitis. No bowel wall thickening or inflammatory change. Vascular/Lymphatic: Aortic atherosclerosis. No enlarged abdominal or  pelvic lymph nodes. Reproductive: Status post hysterectomy. No adnexal masses. Other: No free fluid or free gas.  No abdominal wall hernia. Musculoskeletal: No acute or destructive bony lesions. Reconstructed images demonstrate no additional findings. Review of the MIP images confirms the above findings. IMPRESSION: 1. No evidence of pulmonary embolus. 2. No acute intrathoracic process. 3. Cholelithiasis without cholecystitis. 4. Sigmoid diverticulosis without diverticulitis. 5. Nonobstructing 3 mm left renal calculus. 6.  Aortic Atherosclerosis (ICD10-I70.0). Electronically Signed   By: Randa Ngo M.D.   On: 08/02/2021 18:08    Procedures Procedures   Medications Ordered in ED Medications  potassium chloride (KLOR-CON) packet 40 mEq (40 mEq Oral Given 08/02/21 2144)  levothyroxine (SYNTHROID) tablet 88 mcg (88 mcg Oral Given 08/03/21 0519)  citalopram (CELEXA) tablet 20 mg (20 mg Oral Given 08/02/21 2015)  cholecalciferol (VITAMIN D3) tablet 2,000 Units (2,000 Units Oral Given 08/02/21 2143)  simvastatin (ZOCOR) tablet 20 mg (20 mg Oral Given 08/02/21 2015)  chlorthalidone (HYGROTON) tablet 25 mg (25 mg Oral Given 08/02/21 2015)  lactated  ringers bolus 1,000 mL (1,000 mLs Intravenous Bolus 08/02/21 1253)  magnesium oxide (MAG-OX) tablet 400 mg (400 mg Oral Given 08/02/21 1438)  lactated ringers bolus 1,000 mL (1,000 mLs Intravenous Bolus 08/02/21 1629)  LORazepam (ATIVAN) injection 0.5 mg (0.5 mg Intravenous Given 08/02/21 1705)  iohexol (OMNIPAQUE) 350 MG/ML injection 80 mL (80 mLs Intravenous Contrast Given 08/02/21 1733)    ED Course  I have reviewed the triage vital signs and the nursing notes.  Pertinent labs & imaging results that were available during my care of the patient were reviewed by me and considered in my medical decision making (see chart for details).    MDM Rules/Calculators/A&P                          75 year old female presenting following a suicide attempt.  2 days ago, patient left her home in an effort to die in the woods.  She was found this morning and arrives via EMS.  Patient denies any complaints upon arrival. Vital signs are notable for rectal temperature of 96.4.  I suspect this is from cold exposure.  On exam, patient is superficial space to her lower extremities.  She is wearing what appears to be a old and soiled diaper.  Physical exam otherwise unremarkable.  Work-up was initiated to assess for underlying metabolic and/or infectious conditions.  Patient was given bolus of IV fluid.  Warming blanket was applied.  Labs showed hypokalemia.  Replacement was given in the ED. CBC consistent with hemoconcentration.  On reassessment, patient tachycardic in the range of 140.  EKG showed sinus tachycardia.  Her temperature normalized.  It is unclear why she is now tachycardic but additional IV fluids were ordered for dehydration.  Warming blanket was removed.  Additional labs and imaging ordered.  Given her suicide attempt and continued suicidal ideation, IVC paperwork was submitted.  Care of patient was signed out to oncoming ED provider.  Final Clinical Impression(s) / ED Diagnoses Final diagnoses:   Cold exposure    Rx / DC Orders ED Discharge Orders     None        Godfrey Pick, MD 08/03/21 [phone]

## 2021-08-02 NOTE — ED Notes (Signed)
Bair hugger applied to pt.  

## 2021-08-02 NOTE — ED Triage Notes (Signed)
Pt BIBA.  Per EMS pt had multiple stressors at home and reported she was "done" and left her home. Pt has since then been outside for 2 days in the cold. EMS reports there was a missing report filed on the pt yesterday. Unknown hx of dementia.   T - 96 HR 110 BP- 140/70 CBG 103

## 2021-08-03 ENCOUNTER — Emergency Department (HOSPITAL_COMMUNITY): Payer: Medicare Other

## 2021-08-03 NOTE — ED Provider Notes (Signed)
Emergency Medicine Observation Re-evaluation Note  Donna Kirk is a 75 y.o. female, seen on rounds today.  Pt initially presented to the ED for complaints of Cold Exposure Currently, the patient is eating breakfast.  Physical Exam  BP 125/60   Pulse 68   Temp 99.7 F (37.6 C) (Rectal)   Resp 16   Ht 5\' 3"  (1.6 m)   Wt 69.5 kg   SpO2 100%   BMI 27.14 kg/m  Physical Exam General: Awake, alert, nondistressed Cardiac: Mild tachycardia, regular rate Lungs: Breathing is even and unlabored Psych: Flat affect  ED Course / MDM  EKG:EKG Interpretation  Date/Time:  Wednesday August 02 2021 12:24:46 EST Ventricular Rate:  99 PR Interval:  130 QRS Duration: 137 QT Interval:  384 QTC Calculation: 493 R Axis:   -44 Text Interpretation: Sinus rhythm Right bundle branch block Confirmed by Godfrey Pick [phone]) on 08/02/2021 12:29:14 PM  I have reviewed the labs performed to date as well as medications administered while in observation.  Recent changes in the last 24 hours include patient presented yesterday following a suicide attempt.  Psychiatry has evaluated and recommends inpatient psychiatric admission.  Plan  Current plan is for inpatient psychiatric care.  Donna Kirk is under involuntary commitment.     Godfrey Pick, MD 08/03/21 1016

## 2021-08-03 NOTE — BH Assessment (Addendum)
[facility] Assessment Progress Note   Per Sheran Fava, NP , this pt requires psychiatric hospitalization at a facility providing specialty care for geriatric patients at this time.  Pt presents under IVC initiated by EDP Godfrey Pick, MD.  Kerry Dory, Counselor at [facility] reports that they currently have no beds available.  At the direction of Hampton Abbot, MD this writer has sought placement for this patient at facilities outside of the [facility] system.  The following facilities have been contacted to seek placement for this pt, with results as noted:   Beds available, information sent, decision pending: [facility]  Unable to reach: [facility] (left message at 15:31)   At capacity: [place], [place] Coordinator [phone]

## 2021-08-03 NOTE — ED Notes (Signed)
Pt. Assisted onto the bedpan and cleaned

## 2021-08-03 NOTE — ED Notes (Signed)
Family at bedside. Pt calm and cooperative.

## 2021-08-03 NOTE — ED Notes (Signed)
Pt unable to ambulated self to restroom, taken via steady.

## 2021-08-03 NOTE — Consult Note (Addendum)
  Donna Kirk is a a 75 y.o.   female who presented to [facility] after being found outside x 2 days. Consult was placed for psychiatry after patient endorsed increased in multiple stressors at home, and allegedly left the house " Im done. " Her family subsequently filed a missing persons report for her. Patient reportedly was found [place] and heavily [place]ed area. Patient has no obvious psychiatric history. Further chart review does not show patient has any history of neurocognitive impairment, neurovascular disorders, and or otherwise dementia.  On todays reassessment she continues to endorse passive suicidal ideations, but is unwilling to provide any details at this time. She does admit to leaving the home and recent suicide attempt by means of hypothermia. Patient denies any previous psychiatric diagnosis to me. She denies any history of previous suicide attempts or self harm behaviors. She denies any illicit substance use or alcohol use.  Due to current reports, patient endorsing passive suicidal ideations, and events surrounding her presentation to the hospital, will continue to recommend inpatient psychiatric admission at this time. Patient was recently assessed by TTS, please see full note for further details and clinical comprehensive assessment. Patient has been treated and is medically stable x 24 hours for placement to a behavioral health facility.   -Continue inpatient geropsych admission at this time.  If no beds are available at [facility] facility please fax out to inpatient facilities as patient will require inpatient stabilization.  -Continue citalopram 20mg  po daily for depression.

## 2021-08-03 NOTE — Progress Notes (Addendum)
Pt was accepted to [facility] 08/04/21 after 8:15am; Bed Assignment 1 [facility].  Pt meets inpatient criteria per Sheran Fava, NP  Attending Physician will be Dr. Jonelle Sports  Report can be called to: - [phone], [phone], or [phone]  Pt can arrive after 8:15am  Care Team notified via secure chat: Jones Broom, RN. CSW requested that nursing add EDP to chat so they are aware of pt's disposition. CSW also requested that nursing coordinate transportation.  Nadara Mode, [place] 08/03/2021 @ 8:05 PM

## 2021-08-03 NOTE — ED Notes (Signed)
Patient was given her lunch tray. 

## 2021-08-03 NOTE — ED Notes (Signed)
TTS completed  Disposition: Lindon Romp, NP, patient meets inpatient treatment. Geropsychiatry recommended. Disposition SW to secure placement.

## 2021-08-04 NOTE — ED Notes (Signed)
[facility] here for transport.

## 2021-08-04 NOTE — ED Notes (Signed)
Report given to Carrie,  RN at [facility]. 

## 2021-08-07 LAB — CULTURE, BLOOD (ROUTINE X 2)
Culture: NO GROWTH
Culture: NO GROWTH
Special Requests: ADEQUATE

## 2022-03-22 ENCOUNTER — Encounter (INDEPENDENT_AMBULATORY_CARE_PROVIDER_SITE_OTHER): Payer: Medicare Other | Admitting: Ophthalmology

## 2022-03-22 DIAGNOSIS — H33301 Unspecified retinal break, right eye: Secondary | ICD-10-CM | POA: Diagnosis not present

## 2022-03-22 DIAGNOSIS — H35033 Hypertensive retinopathy, bilateral: Secondary | ICD-10-CM | POA: Diagnosis not present

## 2022-03-22 DIAGNOSIS — I10 Essential (primary) hypertension: Secondary | ICD-10-CM | POA: Diagnosis not present

## 2022-03-22 DIAGNOSIS — H43813 Vitreous degeneration, bilateral: Secondary | ICD-10-CM

## 2022-03-22 DIAGNOSIS — H35372 Puckering of macula, left eye: Secondary | ICD-10-CM | POA: Diagnosis not present

## 2022-04-05 ENCOUNTER — Encounter (INDEPENDENT_AMBULATORY_CARE_PROVIDER_SITE_OTHER): Payer: Medicare Other | Admitting: Ophthalmology

## 2022-04-16 ENCOUNTER — Encounter (INDEPENDENT_AMBULATORY_CARE_PROVIDER_SITE_OTHER): Payer: Medicare Other | Admitting: Ophthalmology

## 2022-04-16 DIAGNOSIS — I10 Essential (primary) hypertension: Secondary | ICD-10-CM

## 2022-04-16 DIAGNOSIS — H35033 Hypertensive retinopathy, bilateral: Secondary | ICD-10-CM | POA: Diagnosis not present

## 2022-04-16 DIAGNOSIS — H33301 Unspecified retinal break, right eye: Secondary | ICD-10-CM

## 2022-04-16 DIAGNOSIS — H43813 Vitreous degeneration, bilateral: Secondary | ICD-10-CM

## 2022-04-16 DIAGNOSIS — D3131 Benign neoplasm of right choroid: Secondary | ICD-10-CM | POA: Diagnosis not present

## 2022-04-17 NOTE — Progress Notes (Unsigned)
Cardiology Office Note   Date:  04/17/2022   ID:  Donna Kirk, DOB 1946-04-13, MRN [id number]  PCP:  [name], MD  Cardiologist:   Minus Breeding, MD   No chief complaint on file.     History of Present Illness: Donna Kirk is a 76 y.o. female who presents for follow-up of an abnormal echocardiogram with moderate aortic insufficiency and some septal hypertrophy.  This on echo when she was found to have a murmur and an abnormal chest x-ray. She had aortic sclerosis and mild AI on echo but no other significant abnormalities.  Since she was last seen ***  ***  she has done well. The patient denies any new symptoms such as chest discomfort, neck or arm discomfort. There has been no new shortness of breath, PND or orthopnea. There have been no reported palpitations, presyncope or syncope.  She walks his dog which is a Neurosurgeon.   Past Medical History:  Diagnosis Date   Breast cancer ([place])    Hypertension    Hypothyroidism     Past Surgical History:  Procedure Laterality Date   ABDOMINAL HYSTERECTOMY     BREAST CYST EXCISION  07/03/2012   BREAST SURGERY  1993   right mastectomy-nodes removed   COLONOSCOPY     x2   DE QUERVAIN'S RELEASE  2009   right   DE QUERVAIN'S RELEASE  2006   left   MASTOPEXY  07/03/2012   Procedure: MASTOPEXY;  Surgeon: Theodoro Kos, DO;  Location: [facility];  Service: Plastics;  Laterality: Left;  left breast mastopexy reduction, left lateral breast excision with liposuction for symmetry   TUBAL LIGATION       Current Outpatient Medications  Medication Sig Dispense Refill   acetaminophen (TYLENOL) 650 MG CR tablet Take 650 mg by mouth every 8 (eight) hours as needed for pain.     Azelaic Acid 15 % gel 1 application daily as needed (for face).     chlorthalidone (HYGROTON) 25 MG tablet TAKE 1 TABLET(25 MG) BY MOUTH DAILY (Patient taking differently: Take 25 mg by mouth daily.) 90 tablet 3    Cholecalciferol (VITAMIN D3) 50 MCG (2000 UT) TABS Take 2,000 Units by mouth daily.     citalopram (CELEXA) 20 MG tablet Take 20 mg by mouth daily.     levothyroxine (SYNTHROID) 88 MCG tablet Take 88 mcg by mouth every morning.     potassium chloride SA (KLOR-CON) 20 MEQ tablet Take 20-40 mEq by mouth See admin instructions. Takes 40 mg on Thursday, Friday and Saturday and 20 mg on all other days     simvastatin (ZOCOR) 20 MG tablet Take 20 mg by mouth daily.  1   No current facility-administered medications for this visit.    Allergies:   Patient has no known allergies.    ROS:  Please see the history of present illness.   Otherwise, review of systems are positive for ***.   All other systems are reviewed and negative.    PHYSICAL EXAM: VS:  There were no vitals taken for this visit. , BMI There is no height or weight on file to calculate BMI. GENERAL:  Well appearing NECK:  No jugular venous distention, waveform within normal limits, carotid upstroke brisk and symmetric, no bruits, no thyromegaly LUNGS:  Clear to auscultation bilaterally CHEST:  Unremarkable HEART:  PMI not displaced or sustained,S1 and S2 within normal limits, no S3, no S4, no clicks, no rubs, ***  murmurs ABD:  Flat, positive bowel sounds normal in frequency in pitch, no bruits, no rebound, no guarding, no midline pulsatile mass, no hepatomegaly, no splenomegaly EXT:  2 plus pulses throughout, no edema, no cyanosis no clubbing    ***GENERAL:  Well appearing NECK:  No jugular venous distention, waveform within normal limits, carotid upstroke brisk and symmetric, no bruits, no thyromegaly LUNGS:  Clear to auscultation bilaterally CHEST:  Unremarkable HEART:  PMI not displaced or sustained,S1 and S2 within normal limits, no S3, no S4, no clicks, no rubs, soft apical brief systolic murmur nonradiating, no diastolic murmurs ABD:  Flat, positive bowel sounds normal in frequency in pitch, no bruits, no rebound, no  guarding, no midline pulsatile mass, no hepatomegaly, no splenomegaly EXT:  2 plus pulses throughout, no edema, no cyanosis no clubbing   EKG:  EKG is *** ordered today. The ekg ordered today demonstrates rate *** right bundle branch block block, left axis deviation, could not exclude old inferior infarct.   Recent Labs: 08/02/2021: ALT 22; BUN 20; Creatinine, Ser 0.80; Hemoglobin 18.6; Magnesium 1.9; Platelets 498; Potassium 2.8; Sodium 137    Lipid Panel No results found for: "CHOL", "TRIG", "HDL", "CHOLHDL", "VLDL", "LDLCALC", "LDLDIRECT"    Wt Readings from Last 3 Encounters:  08/02/21 153 lb 3.5 oz (69.5 kg)  06/28/21 153 lb 3.2 oz (69.5 kg)  04/11/20 168 lb 3.2 oz (76.3 kg)      Other studies Reviewed: Additional studies/ records that were reviewed today include: ***. Review of the above records demonstrates:  ***    ASSESSMENT AND PLAN:  MODERATE AI/SEPTAL HYPERTROPHY:   *** She is not having any problems related to this.  There is no dynamic outflow obstruction.  I will follow up with a physical exam in couple of years but no further imaging is indicated.  HTN:  Blood pressure is *** at target.  No change in therapy.   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 ***  I reviewed this with her today.     Current medicines are reviewed at length with the patient today.  The patient does not have concerns regarding medicines.  The following changes have been made:  ***  Labs/ tests ordered today include:  ***  No orders of the defined types were placed in this encounter.    Disposition:   FU with me in ***   Signed, Minus Breeding, MD  04/17/2022 8:53 PM    [place]

## 2022-04-19 ENCOUNTER — Ambulatory Visit: Payer: Medicare Other | Admitting: Cardiology

## 2022-04-19 ENCOUNTER — Encounter: Payer: Self-pay | Admitting: Cardiology

## 2022-04-19 VITALS — BP 134/86 | HR 86 | Ht 63.0 in | Wt 159.6 lb

## 2022-04-19 DIAGNOSIS — I422 Other hypertrophic cardiomyopathy: Secondary | ICD-10-CM

## 2022-04-19 DIAGNOSIS — I4510 Unspecified right bundle-branch block: Secondary | ICD-10-CM | POA: Diagnosis not present

## 2022-04-19 DIAGNOSIS — I10 Essential (primary) hypertension: Secondary | ICD-10-CM

## 2022-04-19 MED ORDER — CHLORTHALIDONE 25 MG PO TABS: 25.0000 mg | ORAL_TABLET | Freq: Every day | ORAL | 3 refills | Status: DC

## 2022-04-19 NOTE — Patient Instructions (Signed)
Medication Instructions:  Your physician recommends that you continue on your current medications as directed. Please refer to the Current Medication list given to you today.  *If you need a refill on your cardiac medications before your next appointment, please call your pharmacy*  Follow-Up: At [facility], you and your health needs are our priority.  As part of our continuing mission to provide you with exceptional heart care, we have created designated Provider Care Teams.  These Care Teams include your primary Cardiologist (physician) and Advanced Practice Providers (APPs -  Physician Assistants and Nurse Practitioners) who all work together to provide you with the care you need, when you need it.  We recommend signing up for the patient portal called "MyChart".  Sign up information is provided on this After Visit Summary.  MyChart is used to connect with patients for Virtual Visits (Telemedicine).  Patients are able to view lab/test results, encounter notes, upcoming appointments, etc.  Non-urgent messages can be sent to your provider as well.   To learn more about what you can do with MyChart, go to [email or web address].    Your next appointment:   18 month(s)  The format for your next appointment:   In Person  Provider:   Minus Breeding, MD {    Important Information About Sugar

## 2022-06-14 ENCOUNTER — Telehealth: Payer: Self-pay | Admitting: Hematology and Oncology

## 2022-06-14 NOTE — Telephone Encounter (Signed)
Scheduled appt per 10/4 referral. Pt is aware of appt date and time. Pt is aware to arrive 15 mins prior to appt time and to bring and updated insurance card. Pt is aware of appt location.   

## 2022-07-10 ENCOUNTER — Inpatient Hospital Stay: Payer: Medicare Other | Attending: Hematology and Oncology | Admitting: Hematology and Oncology

## 2022-07-10 VITALS — BP 154/68 | HR 79 | Temp 97.5°F | Resp 18 | Ht 63.0 in | Wt 155.8 lb

## 2022-07-10 DIAGNOSIS — Z90710 Acquired absence of both cervix and uterus: Secondary | ICD-10-CM | POA: Diagnosis not present

## 2022-07-10 DIAGNOSIS — Z853 Personal history of malignant neoplasm of breast: Secondary | ICD-10-CM | POA: Insufficient documentation

## 2022-07-10 DIAGNOSIS — R5383 Other fatigue: Secondary | ICD-10-CM | POA: Diagnosis not present

## 2022-07-10 DIAGNOSIS — I10 Essential (primary) hypertension: Secondary | ICD-10-CM | POA: Insufficient documentation

## 2022-07-10 DIAGNOSIS — D45 Polycythemia vera: Secondary | ICD-10-CM

## 2022-07-10 HISTORY — DX: Polycythemia vera: D45

## 2022-07-10 NOTE — Assessment & Plan Note (Signed)
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 Counseling: I discussed with the patient the difference between primary polycythemia vera versus secondary polycythemia.  I explained the pathophysiology of the JAK2 mutation and how it leads to increase erythrocytosis.  Plan: 1.  Generally the signs for hyperviscosity did not happen until patient's hemoglobin crosses 18 g. 2. over patient has severe fatigue even at 15.6 g of hemoglobin and therefore we decided to do 1 unit of phlebotomy and see how she feels.  If she feels markedly better then we will try to do a phlebotomy about 15 g.  If she does not feel any better after the phlebotomy then we will not proceed with any further phlebotomies until the hemoglobin crosses 18.  Return to clinic every 2 months with labs and follow-up

## 2022-07-10 NOTE — Progress Notes (Signed)
[facility] NOTE  Patient Care Team: [name], MD as PCP - General (Family Medicine) Minus Breeding, MD as PCP - Cardiology (Cardiology)  CHIEF COMPLAINTS/PURPOSE OF CONSULTATION:  Polycythemia vera  HISTORY OF PRESENTING ILLNESS:  Donna Kirk 76 y.o. female is here because of recent diagnosis of polycythemia vera.  She was complaining of fatigue and routine blood work showed elevated hemoglobin levels.  This led to JAK2 mutation analysis which came back as positive.  She is complaining of profound fatigue.  Her thyroid tests were normal.  Therefore she does not have any other clear-cut etiology for the fatigue.  She tells me that her brother-in-law also has polycythemia and gets phlebotomy periodically.  I reviewed her records extensively and collaborated the history with the patient.  MEDICAL HISTORY:  Past Medical History:  Diagnosis Date   Breast cancer ([place])    Hypertension    Hypothyroidism     SURGICAL HISTORY: Past Surgical History:  Procedure Laterality Date   ABDOMINAL HYSTERECTOMY     BREAST CYST EXCISION  07/03/2012   BREAST SURGERY  1993   right mastectomy-nodes removed   COLONOSCOPY     x2   DE QUERVAIN'S RELEASE  2009   right   DE QUERVAIN'S RELEASE  2006   left   MASTOPEXY  07/03/2012   Procedure: MASTOPEXY;  Surgeon: Theodoro Kos, DO;  Location: [facility];  Service: Plastics;  Laterality: Left;  left breast mastopexy reduction, left lateral breast excision with liposuction for symmetry   TUBAL LIGATION      SOCIAL HISTORY: Social History   Socioeconomic History   Marital status: Married    Spouse name: Not on file   Number of children: 2   Years of education: Not on file   Highest education level: Not on file  Occupational History   Not on file  Tobacco Use   Smoking status: Never   Smokeless tobacco: Never  Substance and Sexual Activity   Alcohol use: No   Drug use: No   Sexual  activity: Not on file  Other Topics Concern   Not on file  Social History Narrative   Lives with husband and youngest son.     Social Determinants of Health   Financial Resource Strain: Not on file  Food Insecurity: Not on file  Transportation Needs: Not on file  Physical Activity: Not on file  Stress: Not on file  Social Connections: Not on file  Intimate Partner Violence: Not on file    FAMILY HISTORY: Family History  Problem Relation Age of Onset   CAD Father     ALLERGIES:  has No Known Allergies.  MEDICATIONS:  Current Outpatient Medications  Medication Sig Dispense Refill   acetaminophen (TYLENOL) 650 MG CR tablet Take 650 mg by mouth every 8 (eight) hours as needed for pain.     Azelaic Acid 15 % gel 1 application daily as needed (for face). (Patient not taking: Reported on 04/19/2022)     chlorthalidone (HYGROTON) 25 MG tablet Take 1 tablet (25 mg total) by mouth daily. 90 tablet 3   Cholecalciferol (VITAMIN D3) 50 MCG (2000 UT) TABS Take 2,000 Units by mouth daily.     citalopram (CELEXA) 20 MG tablet Take 20 mg by mouth daily. (Patient not taking: Reported on 04/19/2022)     levothyroxine (SYNTHROID) 88 MCG tablet Take 88 mcg by mouth every morning.     potassium chloride SA (KLOR-CON) 20 MEQ tablet Take 20-40  mEq by mouth See admin instructions. Takes 40 mg on Thursday, Friday and Saturday and 20 mg on all other days (Patient not taking: Reported on 04/19/2022)     simvastatin (ZOCOR) 20 MG tablet Take 20 mg by mouth daily.  1   No current facility-administered medications for this visit.    REVIEW OF SYSTEMS:   Constitutional: Denies fevers, chills or abnormal night sweats Complains of severe fatigue All other systems were reviewed with the patient and are negative.  PHYSICAL EXAMINATION: ECOG PERFORMANCE STATUS: 1 - Symptomatic but completely ambulatory  Vitals:   07/10/22 1542  BP: (!) 154/68  Pulse: 79  Resp: 18  Temp: (!) 97.5 F (36.4 C)  SpO2: 97%    Filed Weights   07/10/22 1542  Weight: 155 lb 12.8 oz (70.7 kg)    GENERAL:alert, no distress and comfortable B  LABORATORY DATA:  I have reviewed the data as listed Lab Results  Component Value Date   WBC 15.9 (H) 08/02/2021   HGB 18.6 (H) 08/02/2021   HCT 52.5 (H) 08/02/2021   MCV 86.3 08/02/2021   PLT 498 (H) 08/02/2021   Lab Results  Component Value Date   NA 137 08/02/2021   K 2.8 (L) 08/02/2021   CL 93 (L) 08/02/2021   CO2 28 08/02/2021    RADIOGRAPHIC STUDIES: I have personally reviewed the radiological reports and agreed with the findings in the report.  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 Counseling: I discussed with the patient the difference between primary polycythemia vera versus secondary polycythemia.  I explained the pathophysiology of the JAK2 mutation and how it leads to increase erythrocytosis.  Plan: 1.  Generally the signs for hyperviscosity did not happen until patient's hemoglobin crosses 18 g. 2. over patient has severe fatigue even at 15.6 g of hemoglobin and therefore we decided to do 1 unit of phlebotomy and see how she feels.  If she feels markedly better then we will try to do a phlebotomy about 15 g.  If she does not feel any better after the phlebotomy then we will not proceed with any further phlebotomies until the hemoglobin crosses 18.  Return to clinic every 2 months with labs and follow-up  All questions were answered. The patient knows to call the clinic with any problems, questions or concerns.    Harriette Ohara, MD 07/10/22

## 2022-07-16 ENCOUNTER — Inpatient Hospital Stay (HOSPITAL_BASED_OUTPATIENT_CLINIC_OR_DEPARTMENT_OTHER): Payer: Medicare Other | Admitting: Physician Assistant

## 2022-07-16 ENCOUNTER — Inpatient Hospital Stay: Payer: Medicare Other | Attending: Hematology and Oncology

## 2022-07-16 VITALS — BP 119/58 | HR 74 | Temp 98.3°F | Resp 18

## 2022-07-16 DIAGNOSIS — R42 Dizziness and giddiness: Secondary | ICD-10-CM | POA: Diagnosis not present

## 2022-07-16 DIAGNOSIS — D45 Polycythemia vera: Secondary | ICD-10-CM

## 2022-07-16 DIAGNOSIS — Z79899 Other long term (current) drug therapy: Secondary | ICD-10-CM | POA: Diagnosis not present

## 2022-07-16 NOTE — Progress Notes (Signed)
    DATE:  07/16/22                                   MD: Lindi Adie   Patient here today for phlebotomy     VS: BP:     102/64   P:       79       SPO2:       96% RA                BP:     119/58   P:       72       SPO2:       100 % RA     REACTION(S):           light headed, near syncope   PREMEDS:     none   INTERVENTION: IVF   Review of Systems  Review of Systems  Neurological:  Positive for light-headedness.  All other systems reviewed and are negative.    Physical Exam  Physical Exam Vitals and nursing note reviewed.  Constitutional:      Appearance: She is well-developed. She is not ill-appearing or toxic-appearing.  HENT:     Head: Normocephalic.     Nose: Nose normal.  Eyes:     Conjunctiva/sclera: Conjunctivae normal.  Neck:     Vascular: No JVD.  Cardiovascular:     Rate and Rhythm: Normal rate and regular rhythm.     Pulses: Normal pulses.     Heart sounds: Normal heart sounds.  Pulmonary:     Effort: Pulmonary effort is normal.     Breath sounds: Normal breath sounds.  Abdominal:     General: There is no distension.  Musculoskeletal:     Cervical back: Normal range of motion.  Skin:    General: Skin is warm and dry.     Coloration: Skin is pale.  Neurological:     Mental Status: She is oriented to person, place, and time.     OUTCOME:            Patient here for first time phlebotomy, has history of JAK2 mutation.  After 266 mL had been removed she reported to nurse feeling headache and had near syncope.  Phlebotomy was stopped and IV fluids were started.  Patient also given a snack.  She admits to problems with blood draws in the past.  She was observed until returning back to baseline, son will be driving her home.  She will follow-up with oncologist at neck scheduled appointment in January and knows to call sooner if she is symptomatic.  MD made aware.

## 2022-07-16 NOTE — Progress Notes (Signed)
Patient presented for first time phlebotomy 20g secondary phlebotomy kit used at left wrist for phlebotomy. 266g collected and patient reported she didn't feel right. She couldn't describe how she was feeling. Phlebotomy abandoned, IV obtained in same location as phlebotomy and NS was hung. Anda Kraft, [place] was called to chair side. Patient reported feeling much better after 500cc IVF, nutrition and oral hydration. See flowsheet for vitals.

## 2022-08-13 ENCOUNTER — Encounter (INDEPENDENT_AMBULATORY_CARE_PROVIDER_SITE_OTHER): Payer: Medicare Other | Admitting: Ophthalmology

## 2022-08-13 DIAGNOSIS — H43813 Vitreous degeneration, bilateral: Secondary | ICD-10-CM

## 2022-08-13 DIAGNOSIS — D3131 Benign neoplasm of right choroid: Secondary | ICD-10-CM | POA: Diagnosis not present

## 2022-08-13 DIAGNOSIS — H33301 Unspecified retinal break, right eye: Secondary | ICD-10-CM

## 2022-08-13 DIAGNOSIS — I10 Essential (primary) hypertension: Secondary | ICD-10-CM

## 2022-08-13 DIAGNOSIS — H35033 Hypertensive retinopathy, bilateral: Secondary | ICD-10-CM | POA: Diagnosis not present

## 2022-09-04 ENCOUNTER — Telehealth: Payer: Self-pay | Admitting: Hematology and Oncology

## 2022-09-04 NOTE — Telephone Encounter (Signed)
Rescheduled appointments per provider [facility]. Patient is aware of the changes made to her upcoming appointments.

## 2022-09-12 ENCOUNTER — Inpatient Hospital Stay: Payer: Medicare Other | Admitting: Hematology and Oncology

## 2022-09-12 ENCOUNTER — Inpatient Hospital Stay: Payer: Medicare Other

## 2022-09-14 ENCOUNTER — Encounter: Payer: Self-pay | Admitting: Hematology and Oncology

## 2022-09-21 ENCOUNTER — Inpatient Hospital Stay: Payer: Medicare Other | Attending: Hematology and Oncology

## 2022-09-21 ENCOUNTER — Inpatient Hospital Stay (HOSPITAL_BASED_OUTPATIENT_CLINIC_OR_DEPARTMENT_OTHER): Payer: Medicare Other | Admitting: Adult Health

## 2022-09-21 VITALS — BP 153/83 | HR 88 | Temp 97.7°F | Resp 18 | Wt 160.0 lb

## 2022-09-21 DIAGNOSIS — Z853 Personal history of malignant neoplasm of breast: Secondary | ICD-10-CM | POA: Insufficient documentation

## 2022-09-21 DIAGNOSIS — D45 Polycythemia vera: Secondary | ICD-10-CM | POA: Diagnosis not present

## 2022-09-21 DIAGNOSIS — Z90710 Acquired absence of both cervix and uterus: Secondary | ICD-10-CM | POA: Insufficient documentation

## 2022-09-21 DIAGNOSIS — I10 Essential (primary) hypertension: Secondary | ICD-10-CM | POA: Diagnosis not present

## 2022-09-21 DIAGNOSIS — I422 Other hypertrophic cardiomyopathy: Secondary | ICD-10-CM | POA: Insufficient documentation

## 2022-09-21 LAB — CBC WITH DIFFERENTIAL (CANCER CENTER ONLY)
Abs Immature Granulocytes: 0.02 10*3/uL (ref 0.00–0.07)
Basophils Absolute: 0.1 10*3/uL (ref 0.0–0.1)
Basophils Relative: 1 %
Eosinophils Absolute: 0.2 10*3/uL (ref 0.0–0.5)
Eosinophils Relative: 2 %
HCT: 42.9 % (ref 36.0–46.0)
Hemoglobin: 15.2 g/dL — ABNORMAL HIGH (ref 12.0–15.0)
Immature Granulocytes: 0 %
Lymphocytes Relative: 24 %
Lymphs Abs: 1.8 10*3/uL (ref 0.7–4.0)
MCH: 29.7 pg (ref 26.0–34.0)
MCHC: 35.4 g/dL (ref 30.0–36.0)
MCV: 84 fL (ref 80.0–100.0)
Monocytes Absolute: 0.7 10*3/uL (ref 0.1–1.0)
Monocytes Relative: 9 %
Neutro Abs: 4.9 10*3/uL (ref 1.7–7.7)
Neutrophils Relative %: 64 %
Platelet Count: 366 10*3/uL (ref 150–400)
RBC: 5.11 MIL/uL (ref 3.87–5.11)
RDW: 14.1 % (ref 11.5–15.5)
WBC Count: 7.6 10*3/uL (ref 4.0–10.5)
nRBC: 0 % (ref 0.0–0.2)

## 2022-09-21 NOTE — Progress Notes (Signed)
[facility] Follow up:    Donna Downing, MD [place] [id number]   DIAGNOSIS: Polycythemia  SUMMARY OF HEMATOLOGIC HISTORY: History of elevated hemoglobin JAK2 positive (V617F) Intermittent phlebotomy (last 07/16/2022, tolerated poorly)  CURRENT THERAPY: Intermittent phlebotomy  INTERVAL HISTORY: Donna Kirk 77 y.o. female returns for follow-up of her polycythemia.  In November she underwent phlebotomy for hemoglobin of 15.8.  She was not to undergo the full phlebotomy because she felt faint and required IV fluids.  He tells me that she is feeling well today and denies any significant issues.   Patient Active Problem List   Diagnosis Date Noted   Polycythemia vera ([place]) 07/10/2022   RBBB 04/10/2020   Hypertrophic cardiomyopathy ([place]) 04/10/2020   HTN (hypertension) 06/22/2014   Abnormal CXR 06/22/2014   Abnormal EKG 06/22/2014   Postoperative breast asymmetry 07/03/2012    has No Known Allergies.  MEDICAL HISTORY: Past Medical History:  Diagnosis Date   Breast cancer ([place])    Hypertension    Hypothyroidism     SURGICAL HISTORY: Past Surgical History:  Procedure Laterality Date   ABDOMINAL HYSTERECTOMY     BREAST CYST EXCISION  07/03/2012   BREAST SURGERY  1993   right mastectomy-nodes removed   COLONOSCOPY     x2   DE QUERVAIN'S RELEASE  2009   right   DE QUERVAIN'S RELEASE  2006   left   MASTOPEXY  07/03/2012   Procedure: MASTOPEXY;  Surgeon: Theodoro Kos, DO;  Location: [facility];  Service: Plastics;  Laterality: Left;  left breast mastopexy reduction, left lateral breast excision with liposuction for symmetry   TUBAL LIGATION      SOCIAL HISTORY: Social History   Socioeconomic History   Marital status: Married    Spouse name: Not on file   Number of children: 2   Years of education: Not on file   Highest education level: Not on file  Occupational History   Not on file   Tobacco Use   Smoking status: Never   Smokeless tobacco: Never  Substance and Sexual Activity   Alcohol use: No   Drug use: No   Sexual activity: Not on file  Other Topics Concern   Not on file  Social History Narrative   Lives with husband and youngest son.     Social Determinants of Health   Financial Resource Strain: Not on file  Food Insecurity: Not on file  Transportation Needs: Not on file  Physical Activity: Not on file  Stress: Not on file  Social Connections: Not on file  Intimate Partner Violence: Not on file    FAMILY HISTORY: Family History  Problem Relation Age of Onset   CAD Father     Review of Systems  Constitutional:  Negative for appetite change, chills, fatigue, fever and unexpected weight change.  HENT:   Negative for hearing loss, lump/mass and trouble swallowing.   Eyes:  Negative for eye problems and icterus.  Respiratory:  Negative for chest tightness, cough and shortness of breath.   Cardiovascular:  Negative for chest pain, leg swelling and palpitations.  Gastrointestinal:  Negative for abdominal distention, abdominal pain, constipation, diarrhea, nausea and vomiting.  Endocrine: Negative for hot flashes.  Genitourinary:  Negative for difficulty urinating.   Musculoskeletal:  Negative for arthralgias.  Skin:  Negative for itching and rash.  Neurological:  Negative for dizziness, extremity weakness, headaches and numbness.  Hematological:  Negative for adenopathy. Does not  bruise/bleed easily.  Psychiatric/Behavioral:  Negative for depression. The patient is not nervous/anxious.       PHYSICAL EXAMINATION  ECOG PERFORMANCE STATUS: 0 - Asymptomatic  Vitals:   09/21/22 1515  BP: (!) 153/83  Pulse: 88  Resp: 18  Temp: 97.7 F (36.5 C)  SpO2: 98%    Physical Exam Constitutional:      General: She is not in acute distress.    Appearance: Normal appearance. She is not toxic-appearing.  HENT:     Head: Normocephalic and atraumatic.   Eyes:     General: No scleral icterus. Cardiovascular:     Rate and Rhythm: Normal rate and regular rhythm.     Pulses: Normal pulses.     Heart sounds: Normal heart sounds.  Pulmonary:     Effort: Pulmonary effort is normal.     Breath sounds: Normal breath sounds.  Abdominal:     General: Abdomen is flat. Bowel sounds are normal. There is no distension.     Palpations: Abdomen is soft.     Tenderness: There is no abdominal tenderness.  Musculoskeletal:        General: No swelling.     Cervical back: Neck supple.  Lymphadenopathy:     Cervical: No cervical adenopathy.  Skin:    General: Skin is warm and dry.     Findings: No rash.  Neurological:     General: No focal deficit present.     Mental Status: She is alert.  Psychiatric:        Mood and Affect: Mood normal.        Behavior: Behavior normal.     LABORATORY DATA:  CBC    Component Value Date/Time   WBC 7.6 09/21/2022 1502   WBC 15.9 (H) 08/02/2021 1217   RBC 5.11 09/21/2022 1502   HGB 15.2 (H) 09/21/2022 1502   HCT 42.9 09/21/2022 1502   PLT 366 09/21/2022 1502   MCV 84.0 09/21/2022 1502   MCH 29.7 09/21/2022 1502   MCHC 35.4 09/21/2022 1502   RDW 14.1 09/21/2022 1502   LYMPHSABS 1.8 09/21/2022 1502   MONOABS 0.7 09/21/2022 1502   EOSABS 0.2 09/21/2022 1502   BASOSABS 0.1 09/21/2022 1502      ASSESSMENT and THERAPY PLAN:   Polycythemia vera ([place]) Donna Kirk is a 77 year old woman with polycythemia JAK2 positive.  We discussed that likely good phlebotomy parameters for her are if the hemoglobin goes above 18 or hematocrit above 45-48.  Her hemoglobin is 15.2 and her hematocrit is 42 today therefore she will not receive phlebotomy.  We discussed possible symptoms of hyperviscosity and she will return for lab and follow-up in 8 weeks so we can monitor her closely with her hemoglobin to determine if she needs another phlebotomy.    All questions were answered. The patient knows to call the clinic with  any problems, questions or concerns. We can certainly see the patient much sooner if necessary.  Total encounter time:20 minutes*in face-to-face visit time, chart review, lab review, care coordination, order entry, and documentation of the encounter time.    Wilber Bihari, NP 09/25/22 1:21 PM Medical Oncology and Hematology [facility] [place], [place] [address] Tel. [phone]    Fax. [phone]  *Total Encounter Time as defined by the Centers for Medicare and Medicaid Services includes, in addition to the face-to-face time of a patient visit (documented in the note above) non-face-to-face time: obtaining and reviewing outside history, ordering and reviewing medications, tests  or procedures, care coordination (communications with other health care professionals or caregivers) and documentation in the medical record.

## 2022-09-25 ENCOUNTER — Encounter: Payer: Self-pay | Admitting: Hematology and Oncology

## 2022-09-25 NOTE — Assessment & Plan Note (Signed)
Donna Kirk is a 77 year old woman with polycythemia JAK2 positive.  We discussed that likely good phlebotomy parameters for her are if the hemoglobin goes above 18 or hematocrit above 45-48.  Her hemoglobin is 15.2 and her hematocrit is 42 today therefore she will not receive phlebotomy.  We discussed possible symptoms of hyperviscosity and she will return for lab and follow-up in 8 weeks so we can monitor her closely with her hemoglobin to determine if she needs another phlebotomy.

## 2022-11-16 ENCOUNTER — Encounter: Payer: Self-pay | Admitting: Adult Health

## 2022-11-16 ENCOUNTER — Other Ambulatory Visit: Payer: Self-pay

## 2022-11-16 ENCOUNTER — Inpatient Hospital Stay: Payer: Medicare Other

## 2022-11-16 ENCOUNTER — Inpatient Hospital Stay: Payer: Medicare Other | Attending: Hematology and Oncology | Admitting: Adult Health

## 2022-11-16 DIAGNOSIS — D45 Polycythemia vera: Secondary | ICD-10-CM | POA: Insufficient documentation

## 2022-11-16 DIAGNOSIS — Z853 Personal history of malignant neoplasm of breast: Secondary | ICD-10-CM | POA: Diagnosis not present

## 2022-11-16 DIAGNOSIS — I10 Essential (primary) hypertension: Secondary | ICD-10-CM | POA: Diagnosis not present

## 2022-11-16 DIAGNOSIS — Z90710 Acquired absence of both cervix and uterus: Secondary | ICD-10-CM | POA: Insufficient documentation

## 2022-11-16 LAB — CBC WITH DIFFERENTIAL (CANCER CENTER ONLY)
Abs Immature Granulocytes: 0.03 10*3/uL (ref 0.00–0.07)
Basophils Absolute: 0 10*3/uL (ref 0.0–0.1)
Basophils Relative: 1 %
Eosinophils Absolute: 0.1 10*3/uL (ref 0.0–0.5)
Eosinophils Relative: 1 %
HCT: 44 % (ref 36.0–46.0)
Hemoglobin: 15.5 g/dL — ABNORMAL HIGH (ref 12.0–15.0)
Immature Granulocytes: 0 %
Lymphocytes Relative: 23 %
Lymphs Abs: 1.9 10*3/uL (ref 0.7–4.0)
MCH: 29.1 pg (ref 26.0–34.0)
MCHC: 35.2 g/dL (ref 30.0–36.0)
MCV: 82.6 fL (ref 80.0–100.0)
Monocytes Absolute: 0.8 10*3/uL (ref 0.1–1.0)
Monocytes Relative: 9 %
Neutro Abs: 5.3 10*3/uL (ref 1.7–7.7)
Neutrophils Relative %: 66 %
Platelet Count: 369 10*3/uL (ref 150–400)
RBC: 5.33 MIL/uL — ABNORMAL HIGH (ref 3.87–5.11)
RDW: 14.3 % (ref 11.5–15.5)
WBC Count: 8.1 10*3/uL (ref 4.0–10.5)
nRBC: 0 % (ref 0.0–0.2)

## 2022-11-16 NOTE — Patient Instructions (Signed)
Polycythemia Vera  Polycythemia vera (PV) is a form of blood cancer called a myeloproliferative neoplasm (MPN) in which the bone marrow makes too many red blood cells. The bone marrow may also make too many clotting cells (platelets) and white blood cells. Bone marrow is the spongy center of bones where blood cells are produced. Sometimes, this causes an overproduction of blood cells in the liver and spleen, causing those organs to become enlarged. Additionally, people who have PV are at a higher risk for stroke or heart attack because their blood may clot more easily. PV is a long-term, or chronic, disease. What are the causes? Almost all people who have PV have an abnormal gene (genetic mutation) that causes changes in the way that the bone marrow makes blood cells. This gene, which is called JAK2, is not hereditary. This means that it is not passed along from parent to child. It is not known what triggers the genetic mutation that causes the body to produce too many red blood cells. What increases the risk? You are more likely to develop this condition if: You are female. You are 60 years of age or older. What are the signs or symptoms? You may not have any symptoms in the early stage of PV. When symptoms develop, they may include: Itchy skin, especially after bathing. Burning sensation in the hands or feet. Abdominal fullness or feeling full soon after eating. Ulcers on fingers or toes. Shortness of breath. Bone or joint pain. Dizziness. Headache. Tiredness. Ringing in the ears. Weight loss. Unusual bleeding, including nosebleeds or bleeding from gums. How is this diagnosed? This condition may be diagnosed during a routine physical exam and a blood test called a complete blood count (CBC). Your health care provider may also suspect PV if you have symptoms. During the physical exam, your health care provider may find that you have an enlarged liver or spleen. You may also have tests to  confirm the diagnosis. These may include: A procedure to remove a sample of bone marrow for testing (bone marrow biopsy). Blood tests to check for: The JAK2 gene. Low levels of a hormone that helps to regulate red blood cell production (erythropoietin). How is this treated? There is no cure for PV, but treatment can help to control the disease. There are several types of treatment. No single treatment works for everyone. You will need to work with a blood cancer specialist (hematologist) to find the treatment that is best for you. This condition may be treated by: Periodically having some blood removed with a needle (phlebotomy) to lower the number of red blood cells. Taking medicine. Your health care provider may recommend: Low-dose aspirin to lower your risk for blood clots. A medicine to control blood counts. A medicine that slows down the effects of JAK2 gene. Other medicines to treat symptoms such as itching. Follow these instructions at home:  Take over-the-counter and prescription medicines only as told by your health care provider. Do regular exercise as told by your health care provider. Check your hands and feet regularly for any sores that do not heal. Do not use any products that contain nicotine or tobacco. These products include cigarettes, chewing tobacco, and vaping devices, such as e-cigarettes. If you need help quitting, ask your health care provider. Return to your normal activities as told by your health care provider. Ask your health care provider what activities are safe for you. Keep all follow-up visits. This is important. Contact a health care provider if: You have   side effects from your medicines. Your symptoms change or get worse at home. You have blood in your stool or you vomit blood. Get help right away if: You have sudden and severe pain in your abdomen. You have chest pain or difficulty breathing. You have any symptoms of a stroke. "BE FAST" is an easy way  to remember the main warning signs of a stroke: B - Balance. Signs are dizziness, sudden trouble walking, or loss of balance. E - Eyes. Signs are trouble seeing or a sudden change in vision. F - Face. Signs are sudden weakness or numbness of the face, or the face or eyelid drooping on one side. A - Arms. Signs are weakness or numbness in an arm. This happens suddenly and usually on one side of the body. S - Speech. Signs are sudden trouble speaking, slurred speech, or trouble understanding what people say. T - Time. Time to call emergency services. Write down what time symptoms started. You have other signs of a stroke, such as: A sudden, severe headache with no known cause. Nausea or vomiting. Seizure. These symptoms may be an emergency. Get help right away. Call 911. Do not wait to see if the symptoms will go away. Do not drive yourself to the hospital. Summary Polycythemia vera is a form of blood cancer in which the bone marrow makes too many red blood cells. People who have polycythemia vera are at a higher risk for stroke or heart attack because their blood may clot more easily. The disease is most often associated with an abnormal gene (genetic mutation) that causes changes in the way that the bone marrow makes blood cells. There is no cure for PV, but treatment can help to control the disease. It is treated by periodically having some blood removed and by taking medicines. This information is not intended to replace advice given to you by your health care provider. Make sure you discuss any questions you have with your health care provider. Document Revised: 08/22/2021 Document Reviewed: 08/22/2021 Elsevier Patient Education  2023 Elsevier Inc.  

## 2022-11-16 NOTE — Progress Notes (Unsigned)
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 female returns for follow-up of her polycythemia.  She undergoes intermittent phlebotomy and has been feeling well since we saw her last.  We are following her hemoglobin which has been as follows:  Latest Reference Range & Units 06/26/21 19:09 08/02/21 12:17 09/21/22 15:02 11/16/22 14:48  Hemoglobin 12.0 - 15.0 g/dL 16.8 (H) 18.6 (H) 15.2 (H) 15.5 (H)  (H): Data is abnormally high  She endorses persistent fatigue however she has had a couple of health issues with her husband passing away a couple of months ago and the question of depression or thyroid issues.  Patient Active Problem List   Diagnosis Date Noted   Polycythemia vera ([place]) 07/10/2022   RBBB 04/10/2020   Hypertrophic cardiomyopathy ([place]) 04/10/2020   HTN (hypertension) 06/22/2014   Abnormal CXR 06/22/2014   Abnormal EKG 06/22/2014   Postoperative breast asymmetry 07/03/2012    has No Known Allergies.  MEDICAL HISTORY: Past Medical History:  Diagnosis Date   Breast cancer ([place])    Hypertension    Hypothyroidism     SURGICAL HISTORY: Past Surgical History:  Procedure Laterality Date   ABDOMINAL HYSTERECTOMY     BREAST CYST EXCISION  07/03/2012   BREAST SURGERY  1993   right mastectomy-nodes removed   COLONOSCOPY     x2   DE QUERVAIN'S RELEASE  2009   right   DE QUERVAIN'S RELEASE  2006   left   MASTOPEXY  07/03/2012   Procedure: MASTOPEXY;  Surgeon: Theodoro Kos, DO;  Location: [facility];  Service: Plastics;  Laterality: Left;  left breast mastopexy reduction, left lateral breast excision with liposuction for  symmetry   TUBAL LIGATION      SOCIAL HISTORY: Social History   Socioeconomic History   Marital status: Married    Spouse name: Not on file   Number of children: 2   Years of education: Not on file   Highest education level: Not on file  Occupational History   Not on file  Tobacco Use   Smoking status: Never   Smokeless tobacco: Never  Substance and Sexual Activity   Alcohol use: No   Drug use: No   Sexual activity: Not on file  Other Topics Concern   Not on file  Social History Narrative   Lives with husband and youngest son.     Social Determinants of Health   Financial Resource Strain: Not on file  Food Insecurity: Not on file  Transportation Needs: Not on file  Physical Activity: Not on file  Stress: Not on file  Social Connections: Not on file  Intimate Partner Violence: Not on file    FAMILY HISTORY: Family History  Problem Relation Age of Onset   CAD Father     Review of Systems  Constitutional:  Positive for fatigue. Negative for appetite change, chills, fever and unexpected weight change.  HENT:   Negative for hearing loss, lump/mass and trouble swallowing.   Eyes:  Negative for eye problems and icterus.  Respiratory:  Negative for chest tightness, cough and shortness of breath.   Cardiovascular:  Negative for chest pain, leg swelling and palpitations.  Gastrointestinal:  Negative for abdominal distention, abdominal pain,  constipation, diarrhea, nausea and vomiting.  Endocrine: Negative for hot flashes.  Genitourinary:  Negative for difficulty urinating.   Musculoskeletal:  Negative for arthralgias.  Skin:  Negative for itching and rash.  Neurological:  Negative for dizziness, extremity weakness, headaches and numbness.  Hematological:  Negative for adenopathy. Does not bruise/bleed easily.  Psychiatric/Behavioral:  Negative for depression. The patient is not nervous/anxious.       PHYSICAL EXAMINATION  ECOG PERFORMANCE STATUS: 1 - Symptomatic  but completely ambulatory  There were no vitals filed for this visit. Vitals not entered by nursing staff--patient reported they were normal.  Physical Exam Constitutional:      General: She is not in acute distress.    Appearance: Normal appearance. She is not toxic-appearing.  HENT:     Head: Normocephalic and atraumatic.  Eyes:     General: No scleral icterus. Cardiovascular:     Rate and Rhythm: Normal rate and regular rhythm.     Pulses: Normal pulses.     Heart sounds: Normal heart sounds.  Pulmonary:     Effort: Pulmonary effort is normal.     Breath sounds: Normal breath sounds.  Abdominal:     General: Abdomen is flat. Bowel sounds are normal. There is no distension.     Palpations: Abdomen is soft.     Tenderness: There is no abdominal tenderness.  Musculoskeletal:        General: No swelling.     Cervical back: Neck supple.  Lymphadenopathy:     Cervical: No cervical adenopathy.  Skin:    General: Skin is warm and dry.     Findings: No rash.  Neurological:     General: No focal deficit present.     Mental Status: She is alert.  Psychiatric:        Mood and Affect: Mood normal.        Behavior: Behavior normal.     LABORATORY DATA:  CBC    Component Value Date/Time   WBC 8.1 11/16/2022 1448   WBC 15.9 (H) 08/02/2021 1217   RBC 5.33 (H) 11/16/2022 1448   HGB 15.5 (H) 11/16/2022 1448   HCT 44.0 11/16/2022 1448   PLT 369 11/16/2022 1448   MCV 82.6 11/16/2022 1448   MCH 29.1 11/16/2022 1448   MCHC 35.2 11/16/2022 1448   RDW 14.3 11/16/2022 1448   LYMPHSABS 1.9 11/16/2022 1448   MONOABS 0.8 11/16/2022 1448   EOSABS 0.1 11/16/2022 1448   BASOSABS 0.0 11/16/2022 1448    CMP     Component Value Date/Time   NA 137 08/02/2021 1217   NA 142 04/15/2019 1158   K 2.8 (L) 08/02/2021 1217   CL 93 (L) 08/02/2021 1217   CO2 28 08/02/2021 1217   GLUCOSE 119 (H) 08/02/2021 1217   BUN 20 08/02/2021 1217   BUN 17 04/15/2019 1158   CREATININE 0.80 08/02/2021  1217   CALCIUM 10.1 08/02/2021 1217   PROT 7.8 08/02/2021 1217   ALBUMIN 4.9 08/02/2021 1217   AST 29 08/02/2021 1217   ALT 22 08/02/2021 1217   ALKPHOS 104 08/02/2021 1217   BILITOT 3.5 (H) 08/02/2021 1217   GFRNONAA >60 08/02/2021 1217   GFRAA 67 04/15/2019 1158    ASSESSMENT and THERAPY PLAN:   Polycythemia vera ([place]) Xyliana is a 77 year old woman with polycythemia JAK2 positive.  We discussed phlebotomy parameters for her are if the hemoglobin goes above 18 or if she develops increased symptoms.  We had a long discussion about polycythemia  vera, what it means, and reason she would need a phlebotomy.  I gave her detailed information in her after visit summary that reviews this in detail.  We discussed her fatigue and I recommended that she work on increasing her activity slowly.  She also was recommended to follow-up with her primary care and cardiology about other etiologies of her fatigue.  We will see her back in 8 weeks for labs and possible phlebotomy and then again in 16 weeks for labs, follow-up with Dr. Lindi Adie, and possible phlebotomy.  All questions were answered. The patient knows to call the clinic with any problems, questions or concerns. We can certainly see the patient much sooner if necessary.  Total encounter time:30 minutes*in face-to-face visit time, chart review, lab review, care coordination, order entry, and documentation of the encounter time.    Wilber Bihari, NP 11/19/22 9:16 AM Medical Oncology and Hematology [facility] [place], [place] [address] Tel. [phone]    Fax. [phone]  *Total Encounter Time as defined by the Centers for Medicare and Medicaid Services includes, in addition to the face-to-face time of a patient visit (documented in the note above) non-face-to-face time: obtaining and reviewing outside history, ordering and reviewing medications, tests or procedures, care coordination (communications with  other health care professionals or caregivers) and documentation in the medical record.

## 2022-11-19 ENCOUNTER — Encounter: Payer: Self-pay | Admitting: Hematology and Oncology

## 2022-11-19 NOTE — Assessment & Plan Note (Signed)
Donna Kirk is a 77 year old woman with polycythemia JAK2 positive.  We discussed phlebotomy parameters for her are if the hemoglobin goes above 18 or if she develops increased symptoms.  We had a long discussion about polycythemia vera, what it means, and reason she would need a phlebotomy.  I gave her detailed information in her after visit summary that reviews this in detail.  We discussed her fatigue and I recommended that she work on increasing her activity slowly.  She also was recommended to follow-up with her primary care and cardiology about other etiologies of her fatigue.  We will see her back in 8 weeks for labs and possible phlebotomy and then again in 16 weeks for labs, follow-up with Dr. Lindi Adie, and possible phlebotomy.

## 2023-01-11 ENCOUNTER — Inpatient Hospital Stay: Payer: Medicare Other

## 2023-01-11 ENCOUNTER — Inpatient Hospital Stay: Payer: Medicare Other | Attending: Hematology and Oncology

## 2023-01-11 ENCOUNTER — Other Ambulatory Visit: Payer: Self-pay

## 2023-01-11 DIAGNOSIS — D45 Polycythemia vera: Secondary | ICD-10-CM | POA: Diagnosis present

## 2023-01-11 LAB — CBC WITH DIFFERENTIAL (CANCER CENTER ONLY)
Abs Immature Granulocytes: 0.02 10*3/uL (ref 0.00–0.07)
Basophils Absolute: 0.1 10*3/uL (ref 0.0–0.1)
Basophils Relative: 1 %
Eosinophils Absolute: 0.2 10*3/uL (ref 0.0–0.5)
Eosinophils Relative: 3 %
HCT: 43.1 % (ref 36.0–46.0)
Hemoglobin: 15.6 g/dL — ABNORMAL HIGH (ref 12.0–15.0)
Immature Granulocytes: 0 %
Lymphocytes Relative: 24 %
Lymphs Abs: 1.4 10*3/uL (ref 0.7–4.0)
MCH: 29.9 pg (ref 26.0–34.0)
MCHC: 36.2 g/dL — ABNORMAL HIGH (ref 30.0–36.0)
MCV: 82.7 fL (ref 80.0–100.0)
Monocytes Absolute: 0.6 10*3/uL (ref 0.1–1.0)
Monocytes Relative: 9 %
Neutro Abs: 3.7 10*3/uL (ref 1.7–7.7)
Neutrophils Relative %: 63 %
Platelet Count: 316 10*3/uL (ref 150–400)
RBC: 5.21 MIL/uL — ABNORMAL HIGH (ref 3.87–5.11)
RDW: 14.2 % (ref 11.5–15.5)
WBC Count: 6 10*3/uL (ref 4.0–10.5)
nRBC: 0 % (ref 0.0–0.2)

## 2023-01-11 NOTE — Progress Notes (Signed)
Pt. here for phlebotomy Hemoglobin 15.6 g/dL and Hematocrit 40.9 based on treatment parameters no phlebotomy needed. Lillard Anes, NP notified. Pt. left ambulation, no respiratory distress noted.

## 2023-01-24 IMAGING — CT CT ABD-PELV W/ CM
2 of 5 series · 15 of 46 positions shown, 17 images · IV contrast (omnipaque)
Comparison: 09/15/2019

CLINICAL DATA: Abdominal pain, fever, cold exposure

EXAM:
CT ANGIOGRAPHY CHEST
CT ABDOMEN AND PELVIS WITH CONTRAST
TECHNIQUE: Multidetector CT imaging of the chest was performed using the
standard protocol during bolus administration of intravenous
contrast. Multiplanar CT image reconstructions and MIPs were
obtained to evaluate the vascular anatomy. Multidetector CT imaging
of the abdomen and pelvis was performed using the standard protocol
during bolus administration of intravenous contrast.
CONTRAST:  80mL OMNIPAQUE IOHEXOL 350 MG/ML SOLN

[Series 4: axial st · axial · 0.75mm/px · z∈[-638,-208]mm · 12 of 100 slices shown, 14 images]
[im 7/100  soft-tissue]
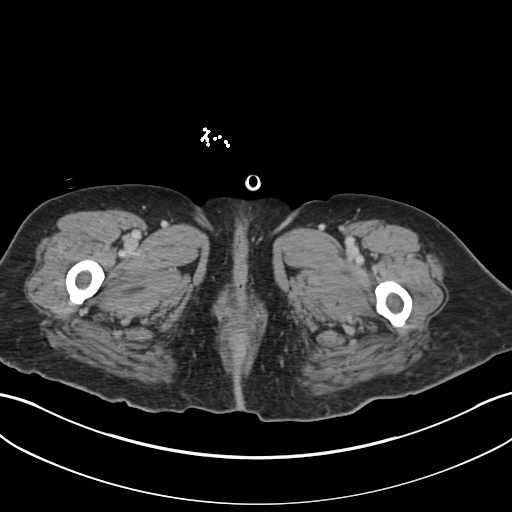
[im 7/100  bone]
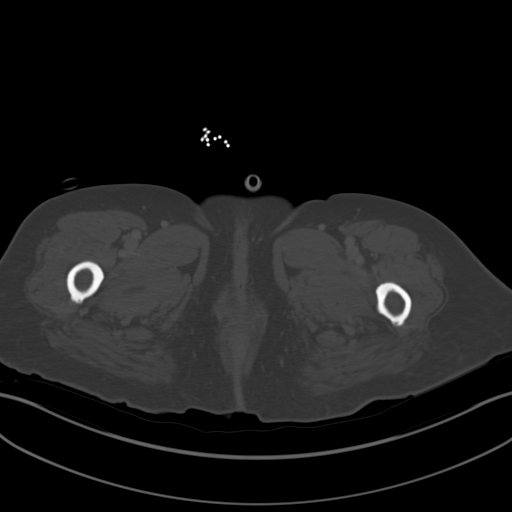
[im 14/100  soft-tissue]
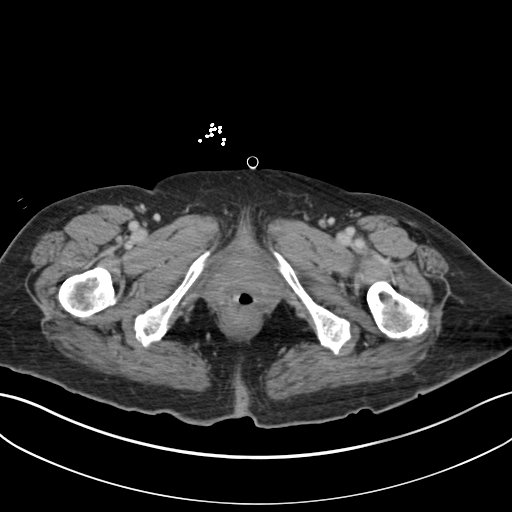
[im 20/100  soft-tissue]
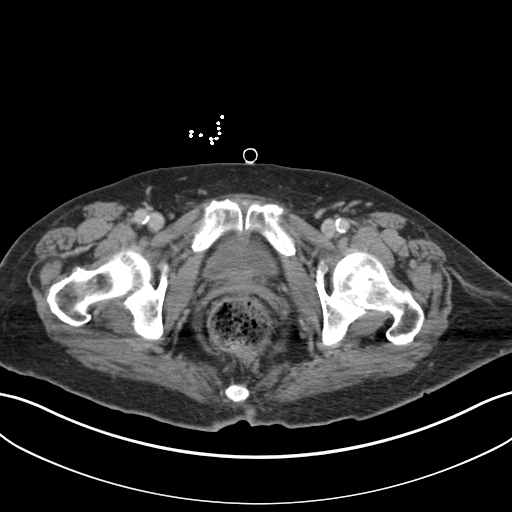
[im 34/100  soft-tissue]
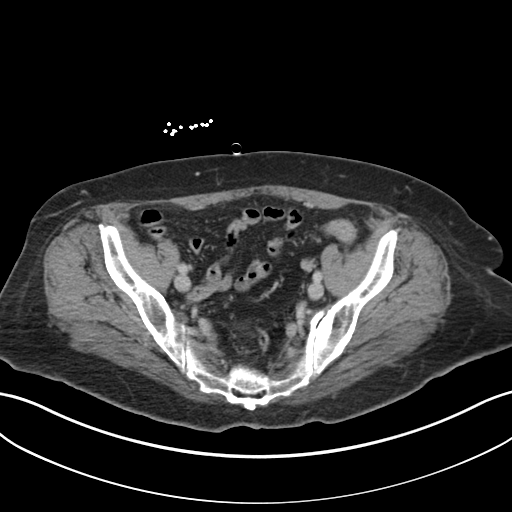
[im 40/100  soft-tissue]
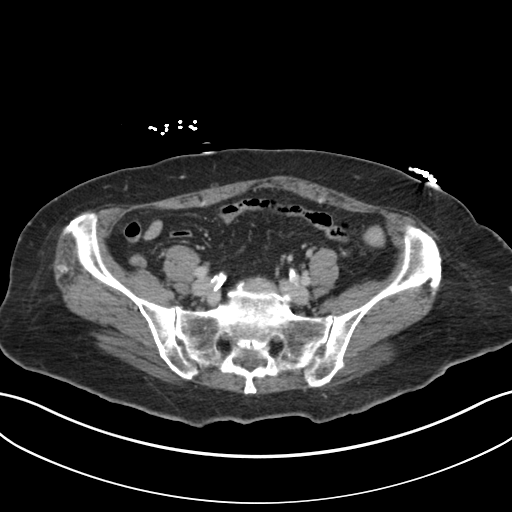
[im 47/100  soft-tissue]
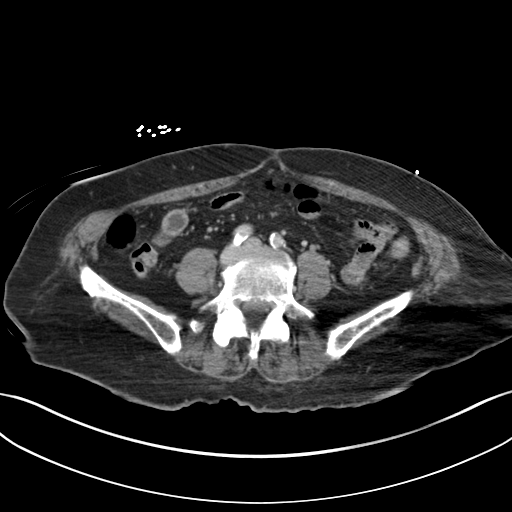
[im 53/100  soft-tissue]
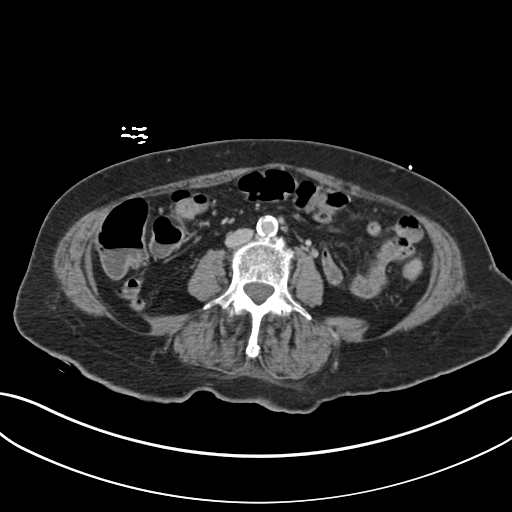
[im 60/100  soft-tissue]
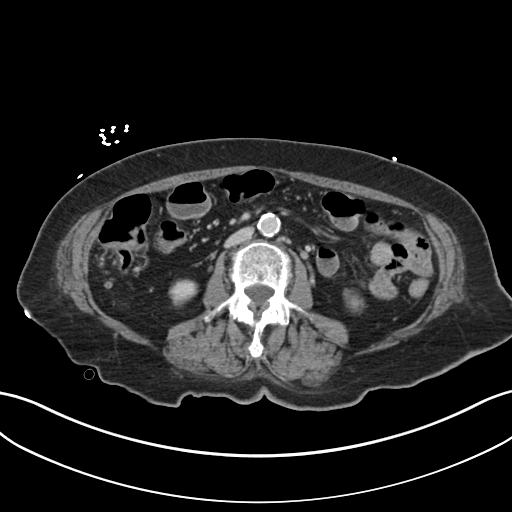
[im 67/100  soft-tissue]
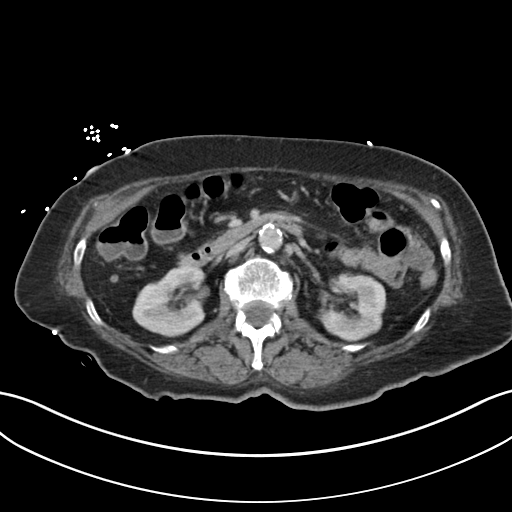
[im 67/100  bone]
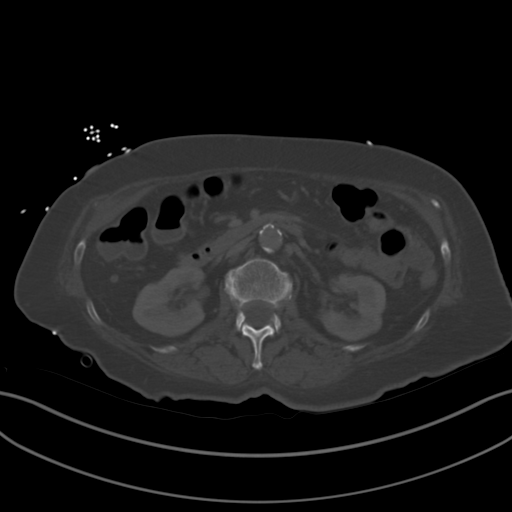
[im 80/100  soft-tissue]
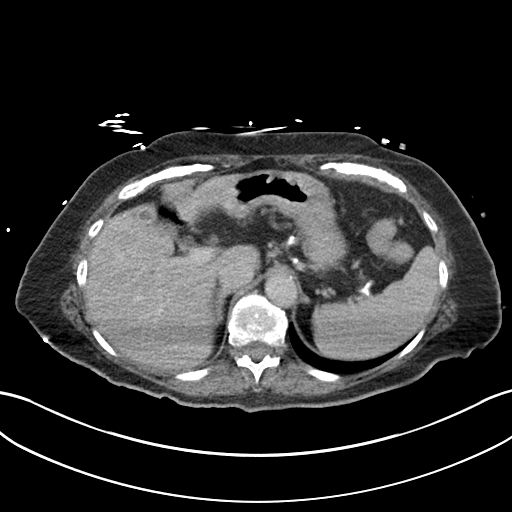
[im 86/100  soft-tissue]
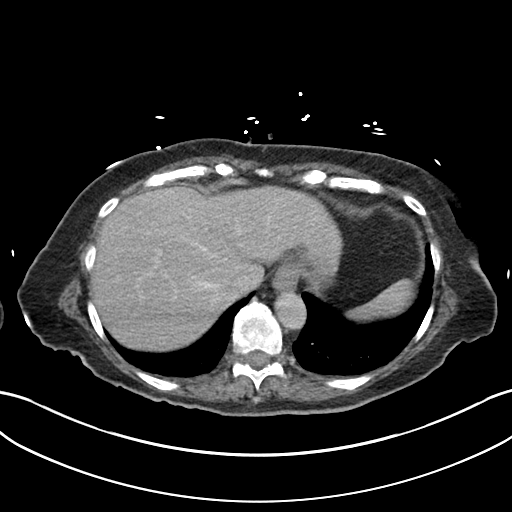
[im 93/100  soft-tissue]
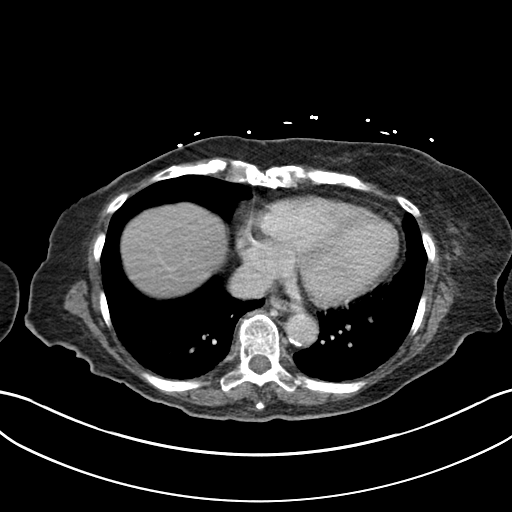

[Series 6: coronal st · coronal · 0.81mm/px · 3 of 73 slices shown]
[im 25/73  soft-tissue]
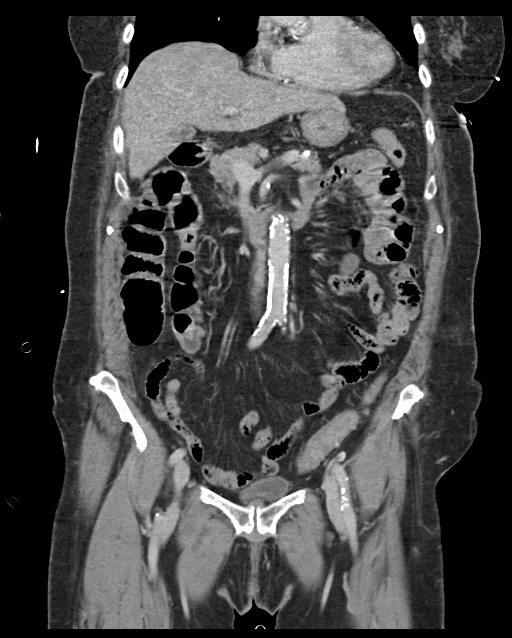
[im 33/73  soft-tissue]
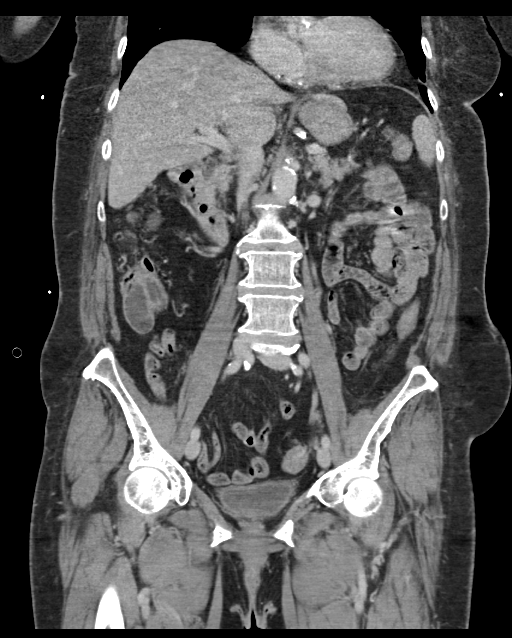
[im 41/73  soft-tissue]
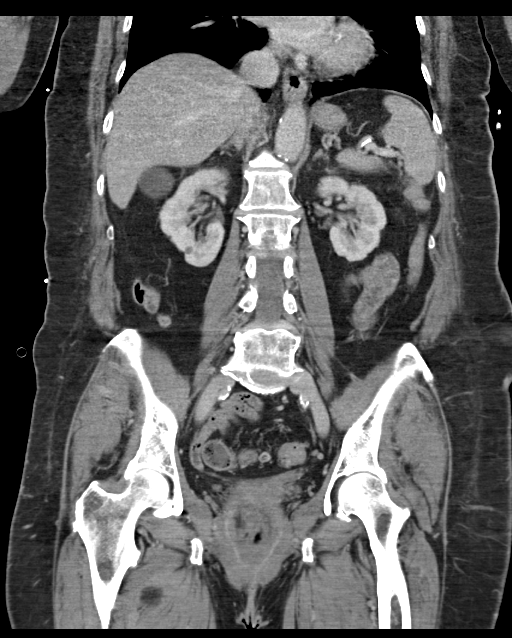

[15 of 46 positions shown; findings below may reference images not displayed]

FINDINGS: CTA CHEST FINDINGS

Cardiovascular: This is a technically adequate evaluation of the
pulmonary vasculature. No filling defects or pulmonary emboli.

The heart is unremarkable without pericardial effusion. No evidence
of thoracic aortic aneurysm or dissection. Mild atherosclerosis of
the aorta and coronary vasculature.

Mediastinum/Nodes: No enlarged mediastinal, hilar, or axillary lymph
nodes. Thyroid gland, trachea, and esophagus demonstrate no
significant findings.

Lungs/Pleura: No acute airspace disease, effusion, or pneumothorax.
Central airways are patent.

Musculoskeletal: Postsurgical changes from right mastectomy. No
acute or destructive bony lesions. Reconstructed images demonstrate
no additional findings.

Review of the MIP images confirms the above findings.

CT ABDOMEN and PELVIS FINDINGS

Hepatobiliary: Gallbladder is decompressed, with a small calcified
gallstone in the gallbladder fundus. No evidence of acute
cholecystitis. Liver is unremarkable.

Pancreas: Unremarkable. No pancreatic ductal dilatation or
surrounding inflammatory changes.

Spleen: Normal in size without focal abnormality.

Adrenals/Urinary Tract: 3 mm nonobstructing calculus upper pole left
kidney. No right-sided calculi. Large simple cyst exophytic off the
upper pole right kidney. Scattered areas of bilateral renal cortical
scarring. No hydronephrosis. The adrenals and bladder are
unremarkable.

Stomach/Bowel: No bowel obstruction or ileus. Normal appendix right
upper quadrant. Minimal sigmoid diverticulosis with no evidence of
acute diverticulitis. No bowel wall thickening or inflammatory
change.

Vascular/Lymphatic: Aortic atherosclerosis. No enlarged abdominal or
pelvic lymph nodes.

Reproductive: Status post hysterectomy. No adnexal masses.

Other: No free fluid or free gas.  No abdominal wall hernia.

Musculoskeletal: No acute or destructive bony lesions. Reconstructed
images demonstrate no additional findings.

Review of the MIP images confirms the above findings.
IMPRESSION: 1. No evidence of pulmonary embolus.
2. No acute intrathoracic process.
3. Cholelithiasis without cholecystitis.
4. Sigmoid diverticulosis without diverticulitis.
5. Nonobstructing 3 mm left renal calculus.
6.  Aortic Atherosclerosis ([id number]).

## 2023-01-25 IMAGING — DX DG CHEST 1V PORT
1 series · 1 of 1 positions shown · non-contrast
Comparison: 08/02/2021

CLINICAL DATA: Cold exposure

EXAM:
PORTABLE CHEST 1 VIEW

[chest ap]
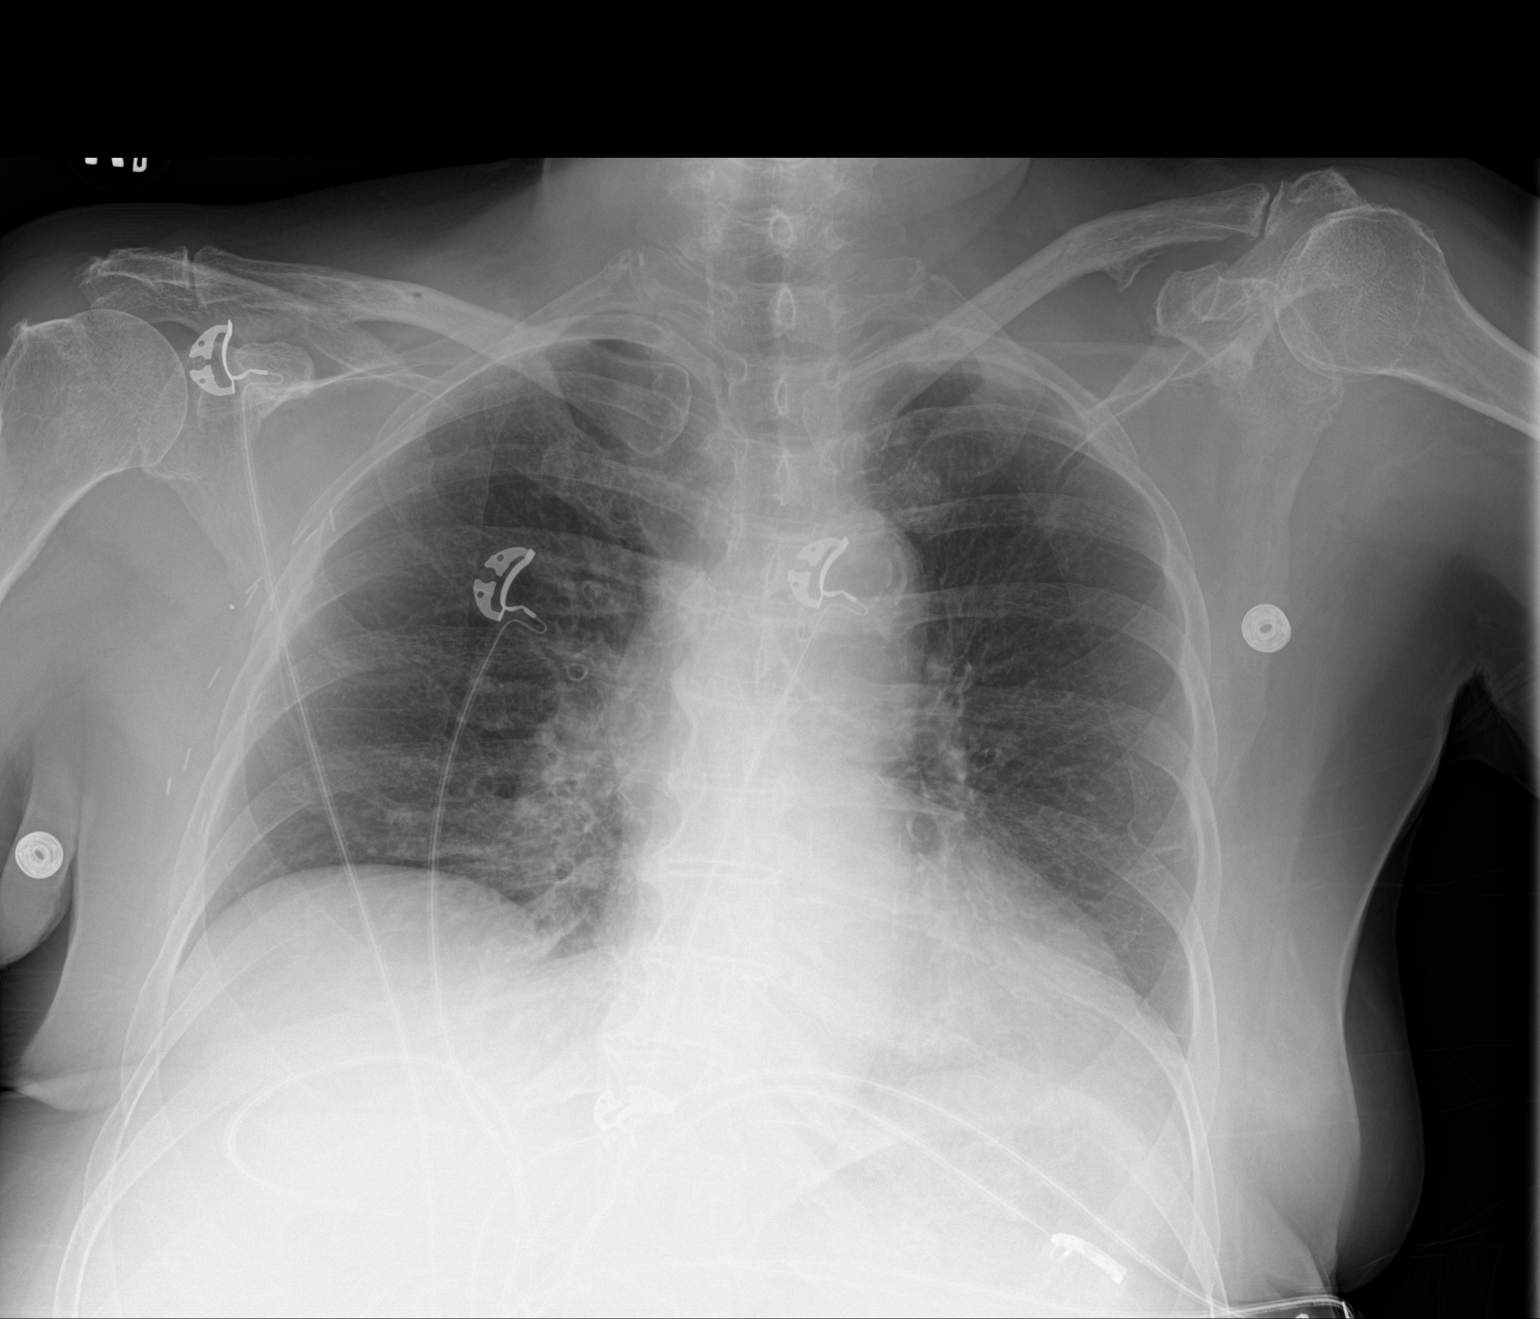

[1 of 1 positions shown; findings below may reference images not displayed]

FINDINGS: The heart size and mediastinal contours are within normal limits.
Atherosclerotic calcification of the aortic knob. Low lung volumes.
No focal airspace consolidation, pleural effusion, or pneumothorax.
The visualized skeletal structures are unremarkable.
IMPRESSION: No active disease.

## 2023-03-03 NOTE — Progress Notes (Signed)
Patient Care Team: Kaleen Mask, MD as PCP - General (Family Medicine) Rollene Rotunda, MD as PCP - Cardiology (Cardiology)  DIAGNOSIS:  Encounter Diagnosis  Name Primary?   Polycythemia vera (H[facility]) Yes   CHIEF COMPLIANT: Polycythemia vera   INTERVAL HISTORY: Donna Kirk is a 77 y.o. female is here because of recent diagnosis of polycythemia vera. She presents to the clinic for a follow-up.  She reports to be feeling increasing fatigue and weakness to minimal exertion.  She cannot tolerate phlebotomy previously because of dizziness    ALLERGIES:  has No Known Allergies.  MEDICATIONS:  Current Outpatient Medications  Medication Sig Dispense Refill   acetaminophen (TYLENOL) 650 MG CR tablet Take 650 mg by mouth every 8 (eight) hours as needed for pain.     aspirin EC 81 MG tablet Take 81 mg by mouth daily. Swallow whole.     chlorthalidone (HYGROTON) 25 MG tablet Take 1 tablet (25 mg total) by mouth daily. 90 tablet 3   Cholecalciferol (VITAMIN D3) 50 MCG (2000 UT) TABS Take 2,000 Units by mouth daily.     levothyroxine (SYNTHROID) 88 MCG tablet Take 88 mcg by mouth every morning.     simvastatin (ZOCOR) 20 MG tablet Take 20 mg by mouth daily.  1   No current facility-administered medications for this visit.    PHYSICAL EXAMINATION: ECOG PERFORMANCE STATUS: 1 - Symptomatic but completely ambulatory  Vitals:   03/08/23 1146  BP: (!) 162/72  Pulse: 91  Resp: 18  Temp: (!) 97.5 F (36.4 C)  SpO2: 95%   Filed Weights   03/08/23 1146  Weight: 167 lb 6.4 oz (75.9 kg)      LABORATORY DATA:  I have reviewed the data as listed    Latest Ref Rng & Units 08/02/2021   12:17 PM 06/26/2021    7:09 PM 04/15/2019   11:58 AM  CMP  Glucose 70 - 99 mg/dL 161  096  045   BUN 8 - 23 mg/dL 20  11  17    Creatinine 0.44 - 1.00 mg/dL 4.09  8.11  9.14   Sodium 135 - 145 mmol/L 137  137  142   Potassium 3.5 - 5.1 mmol/L 2.8  3.0  3.7   Chloride 98 - 111 mmol/L 93  100   100   CO2 22 - 32 mmol/L 28  25  22    Calcium 8.9 - 10.3 mg/dL 78.2  9.7  9.7   Total Protein 6.5 - 8.1 g/dL 7.8  7.1    Total Bilirubin 0.3 - 1.2 mg/dL 3.5  2.2    Alkaline Phos 38 - 126 U/L 104  78    AST 15 - 41 U/L 29  21    ALT 0 - 44 U/L 22  15      Lab Results  Component Value Date   WBC 7.8 03/08/2023   HGB 15.0 03/08/2023   HCT 42.7 03/08/2023   MCV 84.9 03/08/2023   PLT 343 03/08/2023   NEUTROABS 5.0 03/08/2023    ASSESSMENT & PLAN:  Polycythemia vera ([facility]) Lab review: 05/31/2022: Hemoglobin 15.8, hematocrit 46.9, WBC 7.4, platelets 365 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 Treatment: Phlebotomy 07/17/2023  Goal: To keep hemoglobin below 18 Treatment plan: Check labs and phlebotomy as needed  I will see the patient back in 3 months    No orders of the defined types were placed in this encounter.  The  patient has a good understanding of the overall plan. she agrees with it. she will call with any problems that may develop before the next visit here. Total time spent: 30 mins including face to face time and time spent for planning, charting and co-ordination of care   Tamsen Meek, MD 03/08/23    I Janan Ridge am acting as a Neurosurgeon for [employer]  I have reviewed the above documentation for accuracy and completeness, and I agree with the above.

## 2023-03-08 ENCOUNTER — Inpatient Hospital Stay: Payer: Medicare Other

## 2023-03-08 ENCOUNTER — Inpatient Hospital Stay: Payer: Medicare Other | Attending: Hematology and Oncology | Admitting: Hematology and Oncology

## 2023-03-08 ENCOUNTER — Other Ambulatory Visit: Payer: Self-pay

## 2023-03-08 VITALS — BP 162/72 | HR 91 | Temp 97.5°F | Resp 18 | Ht 63.0 in | Wt 167.4 lb

## 2023-03-08 DIAGNOSIS — D45 Polycythemia vera: Secondary | ICD-10-CM | POA: Insufficient documentation

## 2023-03-08 DIAGNOSIS — Z7982 Long term (current) use of aspirin: Secondary | ICD-10-CM | POA: Insufficient documentation

## 2023-03-08 DIAGNOSIS — Z79899 Other long term (current) drug therapy: Secondary | ICD-10-CM | POA: Insufficient documentation

## 2023-03-08 LAB — CBC WITH DIFFERENTIAL (CANCER CENTER ONLY)
Abs Immature Granulocytes: 0.05 10*3/uL (ref 0.00–0.07)
Basophils Absolute: 0 10*3/uL (ref 0.0–0.1)
Basophils Relative: 1 %
Eosinophils Absolute: 0.1 10*3/uL (ref 0.0–0.5)
Eosinophils Relative: 2 %
HCT: 42.7 % (ref 36.0–46.0)
Hemoglobin: 15 g/dL (ref 12.0–15.0)
Immature Granulocytes: 1 %
Lymphocytes Relative: 23 %
Lymphs Abs: 1.8 10*3/uL (ref 0.7–4.0)
MCH: 29.8 pg (ref 26.0–34.0)
MCHC: 35.1 g/dL (ref 30.0–36.0)
MCV: 84.9 fL (ref 80.0–100.0)
Monocytes Absolute: 0.7 10*3/uL (ref 0.1–1.0)
Monocytes Relative: 10 %
Neutro Abs: 5 10*3/uL (ref 1.7–7.7)
Neutrophils Relative %: 63 %
Platelet Count: 343 10*3/uL (ref 150–400)
RBC: 5.03 MIL/uL (ref 3.87–5.11)
RDW: 14.7 % (ref 11.5–15.5)
WBC Count: 7.8 10*3/uL (ref 4.0–10.5)
nRBC: 0 % (ref 0.0–0.2)

## 2023-03-08 NOTE — Assessment & Plan Note (Signed)
Lab review: 05/31/2022: Hemoglobin 15.8, hematocrit 46.9, WBC 7.4, platelets 365 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 Treatment: Phlebotomy 07/17/2023, 01/11/2023 Goal: To keep hemoglobin below 18 Treatment plan: Check labs every 8 weeks and phlebotomy as needed  I will see the patient back in 4 months

## 2023-03-25 ENCOUNTER — Encounter (HOSPITAL_BASED_OUTPATIENT_CLINIC_OR_DEPARTMENT_OTHER): Payer: Self-pay

## 2023-03-25 ENCOUNTER — Emergency Department (HOSPITAL_BASED_OUTPATIENT_CLINIC_OR_DEPARTMENT_OTHER)
Admission: EM | Admit: 2023-03-25 | Discharge: 2023-03-25 | Disposition: A | Discharge status: Home / Self Care | Payer: Medicare Other | Attending: Emergency Medicine | Admitting: Emergency Medicine

## 2023-03-25 ENCOUNTER — Emergency Department (HOSPITAL_BASED_OUTPATIENT_CLINIC_OR_DEPARTMENT_OTHER): Payer: Medicare Other

## 2023-03-25 DIAGNOSIS — Z853 Personal history of malignant neoplasm of breast: Secondary | ICD-10-CM | POA: Insufficient documentation

## 2023-03-25 DIAGNOSIS — M545 Low back pain, unspecified: Secondary | ICD-10-CM

## 2023-03-25 DIAGNOSIS — E039 Hypothyroidism, unspecified: Secondary | ICD-10-CM | POA: Insufficient documentation

## 2023-03-25 DIAGNOSIS — I10 Essential (primary) hypertension: Secondary | ICD-10-CM | POA: Diagnosis not present

## 2023-03-25 DIAGNOSIS — Z7982 Long term (current) use of aspirin: Secondary | ICD-10-CM | POA: Diagnosis not present

## 2023-03-25 MED ORDER — HYDROCODONE-ACETAMINOPHEN 5-325 MG PO TABS
1.0000 | ORAL_TABLET | Freq: Once | ORAL | Status: AC
  Administered 2023-03-25: 1 via ORAL
  Filled 2023-03-25: qty 1

## 2023-03-25 MED ORDER — OXYCODONE HCL 5 MG PO TABS: 5.0000 mg | ORAL_TABLET | Freq: Four times a day (QID) | ORAL | 0 refills | Status: DC | PRN

## 2023-03-25 NOTE — ED Notes (Signed)
Report given ot the next RN.Marland Kitchen

## 2023-03-25 NOTE — ED Provider Notes (Signed)
[place] EMERGENCY DEPARTMENT AT [facility] Provider Note   CSN: [id number] Arrival date & time: 03/25/23  4010     History  Chief Complaint  Patient presents with   Back Pain    Donna Kirk is a 77 y.o. female with past medical history significant for hypothyroidism, hypertension, breast cancer, polycythemia vera presents to the ED complaining of sharp low back pain that began after bending over to retrieve something in the laundry room earlier today.  Patient states she did have some low back pain yesterday, which she took ibuprofen for and seem to help.  Patient states that when she bent over earlier today and stood back up she had a sudden sharp pain in her back that made it very difficult to walk.  Patient states she went to her couch, sat down, and then was unable to get up off of the couch due to the pain.  She does have chronic generalized weakness which she attributes to her diagnosis of polycythemia vera.  She states the pain does not radiate.  Denies numbness, tingling, new or worsening weakness, fall or other trauma, syncope, myalgias, neck pain, arthralgias.       Home Medications Prior to Admission medications   Medication Sig Start Date End Date Taking? Authorizing Provider  acetaminophen (TYLENOL) 650 MG CR tablet Take 650 mg by mouth every 8 (eight) hours as needed for pain.    [provider]  aspirin EC 81 MG tablet Take 81 mg by mouth daily. Swallow whole.    [provider]  chlorthalidone (HYGROTON) 25 MG tablet Take 1 tablet (25 mg total) by mouth daily. 04/19/22   Rollene Rotunda, MD  Cholecalciferol (VITAMIN D3) 50 MCG (2000 UT) TABS Take 2,000 Units by mouth daily.    [provider]  levothyroxine (SYNTHROID) 88 MCG tablet Take 88 mcg by mouth every morning. 06/30/21   [provider]  simvastatin (ZOCOR) 20 MG tablet Take 20 mg by mouth daily. 06/05/15   [provider]      Allergies    Patient  has no known allergies.    Review of Systems   Review of Systems  Musculoskeletal:  Positive for back pain and gait problem. Negative for arthralgias, myalgias and neck pain.  Neurological:  Positive for weakness (chronic, not worse). Negative for syncope and numbness.    Physical Exam Updated Vital Signs BP 119/70 (BP Location: Right Arm)   Pulse 71   Temp 98.4 F (36.9 C) (Oral)   Resp 16   Ht 5\' 3"  (1.6 m)   Wt 75.9 kg   SpO2 96%   BMI 29.64 kg/m  Physical Exam Vitals and nursing note reviewed.  Constitutional:      General: She is not in acute distress.    Appearance: Normal appearance. She is not ill-appearing or diaphoretic.  Cardiovascular:     Rate and Rhythm: Normal rate and regular rhythm.  Pulmonary:     Effort: Pulmonary effort is normal.  Musculoskeletal:     Cervical back: Normal. No tenderness or bony tenderness. No pain with movement.     Thoracic back: Normal. No tenderness or bony tenderness. Normal range of motion.     Lumbar back: No deformity, spasms, tenderness or bony tenderness. Decreased range of motion (due to pain). Negative right straight leg raise test and negative left straight leg raise test.  Neurological:     Mental Status: She is alert. Mental status is at baseline.  Sensory: Sensation is intact.     Motor: Motor function is intact. No weakness.     Gait: Gait is intact.     Comments: 5/5 strength in bilateral lower extremities.   Psychiatric:        Mood and Affect: Mood normal.        Behavior: Behavior normal.     ED Results / Procedures / Treatments   Labs (all labs ordered are listed, but only abnormal results are displayed) Labs Reviewed - No data to display  EKG None  Radiology CT Lumbar Spine Wo Contrast  Result Date: 03/25/2023 CLINICAL DATA:  77 year old female with 2 days of low back pain after bending. Unable to walk. EXAM: CT LUMBAR SPINE WITHOUT CONTRAST TECHNIQUE: Multidetector CT imaging of the lumbar spine  was performed without intravenous contrast administration. Multiplanar CT image reconstructions were also generated. RADIATION DOSE REDUCTION: This exam was performed according to the departmental dose-optimization program which includes automated exposure control, adjustment of the mA and/or kV according to patient size and/or use of iterative reconstruction technique. COMPARISON:  CT Abdomen and Pelvis 08/02/2021. FINDINGS: Segmentation: Normal areas Alignment: Subtle levoconvex lumbar scoliosis is stable. Grade 1 anterolisthesis of L4 on L5 is mild and new since 2022 (3-4 mm). Vertebrae: Diffuse osteopenia. But lumbar and visible lower thoracic vertebral height appears stable since 2022. Visible sacrum and SI joints appear intact. No acute osseous abnormality identified. Paraspinal and other soft tissues: Extensive Aortoiliac calcified atherosclerosis. Normal caliber abdominal aorta. Chronic right renal upper pole cyst with simple fluid density (no follow-up imaging recommended). Left nephrolithiasis and chronic renal cortical scarring. Distal large bowel diverticulosis. Lumbar paraspinal soft tissues are stable. Disc levels: Lower thoracic and lumbar spine degeneration but capacious CT appearance of the spinal canal at most levels. Grade 1 anterolisthesis at L4-L5 is associated with moderate to severe facet arthropathy and circumferential disc/pseudo disc. Mild spinal stenosis suspected there. Mild bilateral L4 foraminal stenosis. IMPRESSION: 1. Osteopenia but no acute osseous abnormality in the Lumbar Spine. If occult compression fracture is suspected noncontrast MRI or Nuclear Medicine Whole-body Bone Scan would evaluate with the highest sensitivity. 2. Progressed L4-L5 degeneration since 2022 with grade 1 spondylolisthesis, disc and severe facet degeneration. Mild multifactorial spinal and foraminal stenosis suspected. 3.  Aortic Atherosclerosis (ICD10-I70.0). Electronically Signed   By: Odessa Fleming M.D.   On:  03/25/2023 11:43    Procedures Procedures    Medications Ordered in ED Medications  HYDROcodone-acetaminophen (NORCO/VICODIN) 5-325 MG per tablet 1 tablet (1 tablet Oral Given 03/25/23 1049)    ED Course/ Medical Decision Making/ A&P                             Medical Decision Making Amount and/or Complexity of Data Reviewed Radiology: ordered.  Risk Prescription drug management.   This patient presents to the ED with chief complaint(s) of acute lower back pain with pertinent past medical history of polycythemia vera.  The complaint involves an extensive differential diagnosis and also carries with it a high risk of complications and morbidity.    The differential diagnosis includes acute fracture or subluxation of lumbar spine, musculoskeletal strain, muscle spasm, degenerative disc disease, osteoarthritis flare, degenerative disc disease   The initial plan is to obtain CT lumbar spine, provide pain control  Additional history obtained: Records reviewed  -patient is being followed by oncology for her polycythemia vera.  Patient did have a follow-up appointment in the month of  June, did not require therapeutic phlebotomy.  Patient did report weakness and fatigue with minimal exertion at this visit.  Initial Assessment:   On exam, patient is resting comfortably in bed and does not appear to be in acute distress.  She has 5/5 strength in bilateral lower extremities with sensation grossly intact.  No obvious gross deformities or step-offs to spine.  No reproducible tenderness on palpation of midline lumbar spine or paraspinal muscles.  Patient does have increase in pain when going from reclined position to sitting up and when going from sitting to standing.  Independent visualization and interpretation of imaging: I independently visualized the following imaging with scope of interpretation limited to determining acute life threatening conditions related to emergency care: Lumbar CT,  which revealed osteopenia but no acute osseous abnormality.  Progressed L4-L5 degeneration with grade 1 spondylolisthesis, disc and severe facet degeneration.  Mild multifactorial spinal and foraminal stenosis suspected.  I agree with radiologist interpretation.  Treatment and Reassessment: Patient was given dose of oral pain medicine with improvement in her symptoms.  Patient was able to ambulate successfully.  She did report mild pain, but did not have difficulty walking.   Disposition:   Suspect patient's back pain is related to a flareup of progressed to degeneration and stenosis of the lumbar spine.  Recommended patient follow-up with her primary care provider, especially since osteopenia was found.  Provided patient with orthopedic follow-up information as well.  Will send patient home with a short course of pain medication to help with her symptoms.  Patient states she does have a walker at home that she will be able to utilize for additional support while ambulating.    The patient has been appropriately medically screened and/or stabilized in the ED. I have low suspicion for any other emergent medical condition which would require further screening, evaluation or treatment in the ED or require inpatient management. At time of discharge the patient is hemodynamically stable and in no acute distress. I have discussed work-up results and diagnosis with patient and answered all questions. Patient is agreeable with discharge plan. We discussed strict return precautions for returning to the emergency department and they verbalized understanding.           Final Clinical Impression(s) / ED Diagnoses Final diagnoses:  Acute midline low back pain without sciatica    Rx / DC Orders ED Discharge Orders     None         Lenard Simmer, PA-C 03/25/23 1319    Ernie Avena, MD 03/25/23 1450

## 2023-03-25 NOTE — ED Triage Notes (Signed)
Onset two days of lower back after bending over.  States not able to walk.  Pain not radiating.

## 2023-03-25 NOTE — ED Notes (Signed)
Pt verbalized understanding of d/c instructions, meds, and followup care. Denies questions. VSS, no distress noted. Steady gait to exit with all belongings.  ?

## 2023-03-25 NOTE — Discharge Instructions (Addendum)
Thank you for allowing [place] to be part of your care today.  You were evaluated in the ED for acute lower back pain.  Your CT imaging did not show evidence of a fracture, however, it did show osteopenia, spinal stenosis, degenerative disc disease, and facet arthrosis.  These are chronic changes that can cause pain during a flare up.  Due to you having osteopenia, I do recommend following up with your primary care provider.  I am sending you home on a short course of pain medicine to help with your acute back pain.  I do recommend utilizing the walker you have at home to help give you additional support to prevent falls or further injury.  Please take this medication as prescribed.  Do not drive, consume alcohol, or perform tasks that require to be completely alert and awake while taking this medication.  For mild to moderate pain, I recommend taking Tylenol 500 to 1000 mg every 8 hours as needed.  Do not exceed 3000 mg of Tylenol in a 24-hour period.  I have also provided you with orthopedic follow-up should you require this.  Return to the ED if you develop sudden worsening of your symptoms, develop weakness or numbness in your legs, loss of bladder or bowel control, or are unable to walk.

## 2023-04-29 ENCOUNTER — Telehealth: Payer: Self-pay | Admitting: Cardiology

## 2023-04-29 MED ORDER — CHLORTHALIDONE 25 MG PO TABS: 25.0000 mg | ORAL_TABLET | Freq: Every day | ORAL | 2 refills | Status: DC

## 2023-04-29 NOTE — Telephone Encounter (Signed)
*  STAT* If patient is at the pharmacy, call can be transferred to refill team.   1. Which medications need to be refilled? (please list name of each medication and dose if known)   chlorthalidone (HYGROTON) 25 MG tablet    2. Which pharmacy/location (including street and city if local pharmacy) is medication to be sent to?  [id number] - [place], [place] - [address]      3. Do they need a 30 day or 90 day supply? 90 day    Pt is completely out of this medication

## 2023-04-29 NOTE — Telephone Encounter (Signed)
Pt's medication was sent to pt's pharmacy as requested. Confirmation received.  °

## 2023-06-07 ENCOUNTER — Inpatient Hospital Stay: Payer: Medicare Other

## 2023-06-07 ENCOUNTER — Telehealth: Payer: Self-pay | Admitting: Hematology and Oncology

## 2023-06-07 ENCOUNTER — Inpatient Hospital Stay: Payer: Medicare Other | Admitting: Hematology and Oncology

## 2023-06-07 NOTE — Telephone Encounter (Signed)
Patient called to cancel 09/27 appointments and tor reschedule to 10/04.

## 2023-06-14 ENCOUNTER — Inpatient Hospital Stay: Payer: Medicare Other | Attending: Nurse Practitioner

## 2023-06-14 ENCOUNTER — Inpatient Hospital Stay: Payer: Medicare Other

## 2023-06-14 DIAGNOSIS — D45 Polycythemia vera: Secondary | ICD-10-CM | POA: Diagnosis present

## 2023-06-14 LAB — CBC WITH DIFFERENTIAL (CANCER CENTER ONLY)
Abs Immature Granulocytes: 0.04 10*3/uL (ref 0.00–0.07)
Basophils Absolute: 0 10*3/uL (ref 0.0–0.1)
Basophils Relative: 1 %
Eosinophils Absolute: 0.2 10*3/uL (ref 0.0–0.5)
Eosinophils Relative: 3 %
HCT: 45.8 % (ref 36.0–46.0)
Hemoglobin: 15.7 g/dL — ABNORMAL HIGH (ref 12.0–15.0)
Immature Granulocytes: 1 %
Lymphocytes Relative: 22 %
Lymphs Abs: 1.7 10*3/uL (ref 0.7–4.0)
MCH: 29.5 pg (ref 26.0–34.0)
MCHC: 34.3 g/dL (ref 30.0–36.0)
MCV: 85.9 fL (ref 80.0–100.0)
Monocytes Absolute: 0.7 10*3/uL (ref 0.1–1.0)
Monocytes Relative: 9 %
Neutro Abs: 5.1 10*3/uL (ref 1.7–7.7)
Neutrophils Relative %: 64 %
Platelet Count: 368 10*3/uL (ref 150–400)
RBC: 5.33 MIL/uL — ABNORMAL HIGH (ref 3.87–5.11)
RDW: 14.4 % (ref 11.5–15.5)
WBC Count: 7.7 10*3/uL (ref 4.0–10.5)
nRBC: 0 % (ref 0.0–0.2)

## 2023-06-14 NOTE — Progress Notes (Signed)
Pt Hgb does not fall within parameters of needing a phlebotomy. Pt informed and states understanding.

## 2023-07-15 ENCOUNTER — Encounter: Payer: Self-pay | Admitting: Hematology and Oncology

## 2023-08-14 ENCOUNTER — Encounter (INDEPENDENT_AMBULATORY_CARE_PROVIDER_SITE_OTHER): Payer: Medicare Other | Admitting: Ophthalmology

## 2023-08-23 ENCOUNTER — Inpatient Hospital Stay: Payer: Medicare Other | Attending: Nurse Practitioner

## 2023-08-23 ENCOUNTER — Inpatient Hospital Stay: Payer: Medicare Other

## 2023-08-23 ENCOUNTER — Inpatient Hospital Stay (HOSPITAL_BASED_OUTPATIENT_CLINIC_OR_DEPARTMENT_OTHER): Payer: Medicare Other | Admitting: Hematology and Oncology

## 2023-08-23 VITALS — BP 148/64 | HR 71 | Temp 98.0°F | Resp 18 | Ht 63.0 in | Wt 168.3 lb

## 2023-08-23 DIAGNOSIS — D45 Polycythemia vera: Secondary | ICD-10-CM | POA: Insufficient documentation

## 2023-08-23 LAB — CBC WITH DIFFERENTIAL (CANCER CENTER ONLY)
Abs Immature Granulocytes: 0.06 10*3/uL (ref 0.00–0.07)
Basophils Absolute: 0.1 10*3/uL (ref 0.0–0.1)
Basophils Relative: 1 %
Eosinophils Absolute: 0.1 10*3/uL (ref 0.0–0.5)
Eosinophils Relative: 1 %
HCT: 47.7 % — ABNORMAL HIGH (ref 36.0–46.0)
Hemoglobin: 15.9 g/dL — ABNORMAL HIGH (ref 12.0–15.0)
Immature Granulocytes: 1 %
Lymphocytes Relative: 13 %
Lymphs Abs: 1.3 10*3/uL (ref 0.7–4.0)
MCH: 28.6 pg (ref 26.0–34.0)
MCHC: 33.3 g/dL (ref 30.0–36.0)
MCV: 85.8 fL (ref 80.0–100.0)
Monocytes Absolute: 0.6 10*3/uL (ref 0.1–1.0)
Monocytes Relative: 6 %
Neutro Abs: 8.2 10*3/uL — ABNORMAL HIGH (ref 1.7–7.7)
Neutrophils Relative %: 78 %
Platelet Count: 423 10*3/uL — ABNORMAL HIGH (ref 150–400)
RBC: 5.56 MIL/uL — ABNORMAL HIGH (ref 3.87–5.11)
RDW: 14.5 % (ref 11.5–15.5)
WBC Count: 10.4 10*3/uL (ref 4.0–10.5)
nRBC: 0 % (ref 0.0–0.2)

## 2023-08-23 NOTE — Progress Notes (Signed)
Patient Care Team: Kaleen Mask, MD as PCP - General (Family Medicine) Rollene Rotunda, MD as PCP - Cardiology (Cardiology)  DIAGNOSIS:  Encounter Diagnosis  Name Primary?   Polycythemia vera (HCC) Yes      CHIEF COMPLIANT: Follow-up of polycythemia vera  HISTORY OF PRESENT ILLNESS:   History of Present Illness   The patient, with a history of blood removal due to high hemoglobin levels, reports feeling better after the last blood removal procedure in October. She has not had any blood drawn since then. She reports feeling 'pretty good' on the day of the visit. She also mentions occasional fatigue, requiring rest, but feels better after resting.         ALLERGIES:  has no known allergies.  MEDICATIONS:  Current Outpatient Medications  Medication Sig Dispense Refill   acetaminophen (TYLENOL) 650 MG CR tablet Take 650 mg by mouth every 8 (eight) hours as needed for pain.     aspirin EC 81 MG tablet Take 81 mg by mouth daily. Swallow whole.     chlorthalidone (HYGROTON) 25 MG tablet Take 1 tablet (25 mg total) by mouth daily. 90 tablet 2   Cholecalciferol (VITAMIN D3) 50 MCG (2000 UT) TABS Take 2,000 Units by mouth daily.     levothyroxine (SYNTHROID) 88 MCG tablet Take 88 mcg by mouth every morning.     oxyCODONE (ROXICODONE) 5 MG immediate release tablet Take 1 tablet (5 mg total) by mouth every 6 (six) hours as needed for severe pain. 16 tablet 0   simvastatin (ZOCOR) 20 MG tablet Take 20 mg by mouth daily.  1   No current facility-administered medications for this visit.    PHYSICAL EXAMINATION: ECOG PERFORMANCE STATUS: 1 - Symptomatic but completely ambulatory  Vitals:   08/23/23 1011  BP: (!) 148/64  Pulse: 71  Resp: 18  Temp: 98 F (36.7 C)  SpO2: 100%   Filed Weights   08/23/23 1011  Weight: 168 lb 4.8 oz (76.3 kg)      LABORATORY DATA:  I have reviewed the data as listed    Latest Ref Rng & Units 08/02/2021   12:17 PM 06/26/2021    7:09  PM 04/15/2019   11:58 AM  CMP  Glucose 70 - 99 mg/dL 846  962  952   BUN 8 - 23 mg/dL 20  11  17    Creatinine 0.44 - 1.00 mg/dL 8.41  3.24  4.01   Sodium 135 - 145 mmol/L 137  137  142   Potassium 3.5 - 5.1 mmol/L 2.8  3.0  3.7   Chloride 98 - 111 mmol/L 93  100  100   CO2 22 - 32 mmol/L 28  25  22    Calcium 8.9 - 10.3 mg/dL 02.7  9.7  9.7   Total Protein 6.5 - 8.1 g/dL 7.8  7.1    Total Bilirubin 0.3 - 1.2 mg/dL 3.5  2.2    Alkaline Phos 38 - 126 U/L 104  78    AST 15 - 41 U/L 29  21    ALT 0 - 44 U/L 22  15      Lab Results  Component Value Date   WBC 10.4 08/23/2023   HGB 15.9 (H) 08/23/2023   HCT 47.7 (H) 08/23/2023   MCV 85.8 08/23/2023   PLT 423 (H) 08/23/2023   NEUTROABS 8.2 (H) 08/23/2023    ASSESSMENT & PLAN:  Polycythemia vera (HCC) Lab review: 05/31/2022: Hemoglobin 15.8, hematocrit 46.9, WBC 7.4,  platelets 365 06/14/2023: Hemoglobin 15.7, hematocrit 45.8  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  Treatment: Phlebotomy Oct/2024 (only half a unit could be removed because of symptoms from patient with nausea)  Goal: Symptom control and to keep hemoglobin less than 18  If her hematocrit and hemoglobin climbs significantly we will have to use hydroxyurea rather than phlebotomy. ------------------------------------- Assessment and Plan    Polycythemia Hemoglobin 15.9, stable since last phlebotomy in October. Patient reports feeling better after phlebotomy. Discussed the threshold for phlebotomy (Hb >18) and potential future need for medication to reduce blood production if phlebotomy becomes challenging. -Monitor hemoglobin levels every 3 months. -Consider medication if hemoglobin consistently >18 or if phlebotomy becomes challenging. -Encourage patient to report symptoms of extreme fatigue or sluggishness.     3 months lab and follow-up     Orders Placed This Encounter  Procedures   CBC with Differential  (Cancer Center Only)    Standing Status:   Future    Expiration Date:   08/22/2024   The patient has a good understanding of the overall plan. she agrees with it. she will call with any problems that may develop before the next visit here. Total time spent: 30 mins including face to face time and time spent for planning, charting and co-ordination of care   Tamsen Meek, MD 08/23/23

## 2023-08-23 NOTE — Assessment & Plan Note (Addendum)
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  Treatment: Phlebotomy Oct/2024 (only half a unit could be removed because of symptoms from patient with nausea)  Goal: Symptom control and to keep hemoglobin less than 18 Treatment plan: Check labs and phlebotomy as needed  I will see the patient back in 3 months  If her hematocrit and hemoglobin climbs significantly we will have to use hydroxyurea rather than phlebotomy.

## 2023-09-13 ENCOUNTER — Encounter (INDEPENDENT_AMBULATORY_CARE_PROVIDER_SITE_OTHER): Payer: Medicare Other | Admitting: Ophthalmology

## 2023-09-13 DIAGNOSIS — H43813 Vitreous degeneration, bilateral: Secondary | ICD-10-CM

## 2023-09-13 DIAGNOSIS — H2513 Age-related nuclear cataract, bilateral: Secondary | ICD-10-CM

## 2023-09-13 DIAGNOSIS — D3131 Benign neoplasm of right choroid: Secondary | ICD-10-CM

## 2023-09-13 DIAGNOSIS — I10 Essential (primary) hypertension: Secondary | ICD-10-CM

## 2023-09-13 DIAGNOSIS — H35033 Hypertensive retinopathy, bilateral: Secondary | ICD-10-CM | POA: Diagnosis not present

## 2023-09-13 DIAGNOSIS — H33301 Unspecified retinal break, right eye: Secondary | ICD-10-CM | POA: Diagnosis not present

## 2023-11-15 ENCOUNTER — Ambulatory Visit: Payer: Medicare Other | Admitting: Cardiology

## 2023-11-22 ENCOUNTER — Ambulatory Visit: Payer: Medicare Other | Admitting: Hematology and Oncology

## 2023-11-22 ENCOUNTER — Other Ambulatory Visit: Payer: Medicare Other

## 2023-11-28 ENCOUNTER — Telehealth: Payer: Self-pay | Admitting: Hematology and Oncology

## 2023-11-28 DIAGNOSIS — I517 Cardiomegaly: Secondary | ICD-10-CM | POA: Insufficient documentation

## 2023-11-28 NOTE — Telephone Encounter (Signed)
 Rescheduled appointments per patients request. Talked with the patient and she is aware of the changes made to her upcoming appointments.

## 2023-11-28 NOTE — Progress Notes (Unsigned)
  Cardiology Office Note:   Date:  11/29/2023  ID:  Donna Kirk, DOB 06-03-1946, MRN [id number] PCP: Donna Mask, MD  [facility] Providers Cardiologist:  Rollene Rotunda, MD {  History of Present Illness:   Donna Kirk is a 78 y.o. female who presents for follow-up of an abnormal echocardiogram with moderate aortic insufficiency and some septal hypertrophy.  This on echo when she was found to have a murmur and an abnormal chest x-ray. She had aortic sclerosis and mild AI on echo but no other significant abnormalities.   Since she was last seen she has been diagnosed with polycythemia rubra vera.  She did not tolerate phlebotomy apparently and is going to have other therapy.  She gets around in her house and does stuff doing yard work but she feels like she is limited just by fatigue and leg tiredness.  She says she is not having any chest discomfort, neck or arm discomfort.  She is not having any new shortness of breath, PND or orthopnea.  She does not notice any palpitations and has had no presyncope or syncope.  Her husband of 58 years died not long ago.  This has been a difficult transition.  ROS: As stated in the HPI and negative for all other systems.  Studies Reviewed:    EKG:   EKG Interpretation Date/Time:  Friday November 29 2023 11:51:06 EDT Ventricular Rate:  80 PR Interval:  138 QRS Duration:  124 QT Interval:  402 QTC Calculation: 463 R Axis:   -43  Text Interpretation: Sinus rhythm with Premature supraventricular complexes Left axis deviation Right bundle branch block Inferior infarct , age undetermined Anterolateral infarct , age undetermined When compared with ECG of 02-Aug-2021 12:24, No significant change since last tracing Confirmed by Rollene Rotunda ([id number]) on 11/29/2023 12:24:42 PM    Risk Assessment/Calculations:              Physical Exam:   VS:  BP 136/82 (BP Location: Left Arm, Patient Position: Sitting)   Pulse 80   Ht 5\' 3"  (1.6  m)   Wt 174 lb (78.9 kg)   SpO2 95%   BMI 30.82 kg/m    Wt Readings from Last 3 Encounters:  11/29/23 174 lb (78.9 kg)  08/23/23 168 lb 4.8 oz (76.3 kg)  03/25/23 167 lb 5.3 oz (75.9 kg)     GEN: Well nourished, well developed in no acute distress NECK: No JVD; No carotid bruits CARDIAC: RRR, 2 out of 6 apical systolic murmur radiating at aortic outflow tract and increasing slightly with the strain phase of Valsalva, no diastolic murmurs, rubs, gallops RESPIRATORY:  Clear to auscultation without rales, wheezing or rhonchi  ABDOMEN: Soft, non-tender, non-distended EXTREMITIES:  No edema; No deformity   ASSESSMENT AND PLAN:   MODERATE AI/SEPTAL HYPERTROPHY:   I do not think she is having symptoms necessarily related to this.  I will check an echocardiogram as it has been 5 years.  At this point no change in therapy.   HTN:  The blood pressure is controlled.  Continue the meds as listed.     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  No change in therapy.  Follow up with me in about 18 months.  Signed, Rollene Rotunda, MD

## 2023-11-29 ENCOUNTER — Ambulatory Visit: Payer: Medicare Other | Attending: Cardiology | Admitting: Cardiology

## 2023-11-29 ENCOUNTER — Encounter: Payer: Self-pay | Admitting: Cardiology

## 2023-11-29 VITALS — BP 136/82 | HR 80 | Ht 63.0 in | Wt 174.0 lb

## 2023-11-29 DIAGNOSIS — I517 Cardiomegaly: Secondary | ICD-10-CM

## 2023-11-29 DIAGNOSIS — I10 Essential (primary) hypertension: Secondary | ICD-10-CM | POA: Diagnosis not present

## 2023-11-29 DIAGNOSIS — R011 Cardiac murmur, unspecified: Secondary | ICD-10-CM | POA: Diagnosis not present

## 2023-11-29 NOTE — Patient Instructions (Addendum)
 Medication Instructions:  - No changes  *If you need a refill on your cardiac medications before your next appointment, please call your pharmacy*   Lab Work: - None ordered   Testing/Procedures: - Echo next available  Your physician has requested that you have an echocardiogram. Echocardiography is a painless test that uses sound waves to create images of your heart. It provides your doctor with information about the size and shape of your heart and how well your heart's chambers and valves are working. This procedure takes approximately one hour. There are no restrictions for this procedure. Please do NOT wear cologne, perfume, aftershave, or lotions (deodorant is allowed). Please arrive 15 minutes prior to your appointment time.  Please note: We ask at that you not bring children with you during ultrasound (echo/ vascular) testing. Due to room size and safety concerns, children are not allowed in the ultrasound rooms during exams. Our front office staff cannot provide observation of children in our lobby area while testing is being conducted. An adult accompanying a patient to their appointment will only be allowed in the ultrasound room at the discretion of the ultrasound technician under special circumstances. We apologize for any inconvenience.    Follow-Up: At [facility], you and your health needs are our priority.  As part of our continuing mission to provide you with exceptional heart care, we have created designated Provider Care Teams.  These Care Teams include your primary Cardiologist (physician) and Advanced Practice Providers (APPs -  Physician Assistants and Nurse Practitioners) who all work together to provide you with the care you need, when you need it.  We recommend signing up for the patient portal called "MyChart".  Sign up information is provided on this After Visit Summary.  MyChart is used to connect with patients for Virtual Visits (Telemedicine).  Patients  are able to view lab/test results, encounter notes, upcoming appointments, etc.  Non-urgent messages can be sent to your provider as well.   To learn more about what you can do with MyChart, go to [email or web address].    Your next appointment:   18 month(s)  Provider:   Rollene Rotunda, MD     Other information:

## 2023-12-05 ENCOUNTER — Inpatient Hospital Stay: Payer: Medicare Other | Admitting: Hematology and Oncology

## 2023-12-05 ENCOUNTER — Inpatient Hospital Stay: Payer: Medicare Other

## 2023-12-06 ENCOUNTER — Other Ambulatory Visit: Payer: Medicare Other

## 2023-12-06 ENCOUNTER — Encounter: Payer: Self-pay | Admitting: Adult Health

## 2023-12-06 ENCOUNTER — Inpatient Hospital Stay: Admitting: Adult Health

## 2023-12-06 ENCOUNTER — Ambulatory Visit: Payer: Medicare Other | Admitting: Hematology and Oncology

## 2023-12-06 ENCOUNTER — Inpatient Hospital Stay: Attending: Nurse Practitioner

## 2023-12-06 VITALS — BP 139/57 | HR 76 | Temp 98.5°F | Resp 18 | Wt 173.8 lb

## 2023-12-06 DIAGNOSIS — D45 Polycythemia vera: Secondary | ICD-10-CM | POA: Diagnosis present

## 2023-12-06 DIAGNOSIS — Z853 Personal history of malignant neoplasm of breast: Secondary | ICD-10-CM | POA: Diagnosis not present

## 2023-12-06 LAB — CBC WITH DIFFERENTIAL (CANCER CENTER ONLY)
Abs Immature Granulocytes: 0.03 10*3/uL (ref 0.00–0.07)
Basophils Absolute: 0.1 10*3/uL (ref 0.0–0.1)
Basophils Relative: 1 %
Eosinophils Absolute: 0.3 10*3/uL (ref 0.0–0.5)
Eosinophils Relative: 3 %
HCT: 46.9 % — ABNORMAL HIGH (ref 36.0–46.0)
Hemoglobin: 15.9 g/dL — ABNORMAL HIGH (ref 12.0–15.0)
Immature Granulocytes: 0 %
Lymphocytes Relative: 22 %
Lymphs Abs: 1.7 10*3/uL (ref 0.7–4.0)
MCH: 28.8 pg (ref 26.0–34.0)
MCHC: 33.9 g/dL (ref 30.0–36.0)
MCV: 84.8 fL (ref 80.0–100.0)
Monocytes Absolute: 0.7 10*3/uL (ref 0.1–1.0)
Monocytes Relative: 9 %
Neutro Abs: 5.1 10*3/uL (ref 1.7–7.7)
Neutrophils Relative %: 65 %
Platelet Count: 398 10*3/uL (ref 150–400)
RBC: 5.53 MIL/uL — ABNORMAL HIGH (ref 3.87–5.11)
RDW: 14.2 % (ref 11.5–15.5)
WBC Count: 7.9 10*3/uL (ref 4.0–10.5)
nRBC: 0 % (ref 0.0–0.2)

## 2023-12-06 NOTE — Progress Notes (Signed)
 [facility] Cancer Follow up:    Kaleen Mask, MD [address] 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   Treatment: Phlebotomy Oct/2024 (only half a unit could be removed because of symptoms from patient with nausea)   Goal: Symptom control and to keep hemoglobin less than 18  CURRENT THERAPY:intermittent phlebotomy  INTERVAL HISTORY:  Discussed the use of AI scribe software for clinical note transcription with the patient, who gave verbal consent to proceed.  Donna Kirk 78 y.o. female returns for f/u of her polycythemia.  She reports a history of an enlarged heart, heart murmur, and elevated hemoglobin, presents for a routine follow-up. She reports a lack of energy and difficulty adhering to the recommended 20 minutes of daily walking due to fatigue. Despite this, she remains active within her home and yard.   The patient denies experiencing any symptoms related to her elevated hemoglobin, such as headaches, vision changes, or confusion. She is currently on a medication regimen that includes aspirin, chlorthalidone, and vitamin D3. She reports that she has stopped taking oxycodone, which was previously prescribed for back pain.   Patient Active Problem List   Diagnosis Date Noted   Asymmetric septal hypertrophy 11/28/2023   Polycythemia vera (HCC) 07/10/2022   RBBB 04/10/2020   Hypertrophic cardiomyopathy (HCC) 04/10/2020   HTN (hypertension) 06/22/2014   Abnormal CXR 06/22/2014   Abnormal EKG 06/22/2014   Postoperative breast asymmetry 07/03/2012    has no known allergies.  MEDICAL HISTORY: Past Medical History:  Diagnosis Date   Breast cancer (HCC)     Hypertension    Hypothyroidism     SURGICAL HISTORY: Past Surgical History:  Procedure Laterality Date   ABDOMINAL HYSTERECTOMY     BREAST CYST EXCISION  07/03/2012   BREAST SURGERY  1993   right mastectomy-nodes removed   COLONOSCOPY     x2   DE QUERVAIN'S RELEASE  2009   right   DE QUERVAIN'S RELEASE  2006   left   MASTOPEXY  07/03/2012   Procedure: MASTOPEXY;  Surgeon: Wayland Denis, DO;  Location: [facility];  Service: Plastics;  Laterality: Left;  left breast mastopexy reduction, left lateral breast excision with liposuction for symmetry   TUBAL LIGATION      SOCIAL HISTORY: Social History   Socioeconomic History   Marital status: Married    Spouse name: Not on file   Number of children: 2   Years of education: Not on file   Highest education level: Not on file  Occupational History   Not on file  Tobacco Use   Smoking status: Never   Smokeless tobacco: Never  Substance and Sexual Activity   Alcohol use: No   Drug use: No   Sexual activity: Not on file  Other Topics Concern   Not on file  Social History Narrative   Lives with husband and youngest son.     Social Drivers of Corporate investment banker Strain: Not on file  Food Insecurity: Not on file  Transportation Needs: Not on file  Physical Activity: Not on file  Stress: Not on file  Social Connections: Not on file  Intimate Partner Violence: Not on file    FAMILY HISTORY: Family History  Problem  Relation Age of Onset   CAD Father     Review of Systems  Constitutional:  Positive for fatigue. Negative for appetite change, chills, fever and unexpected weight change.  HENT:   Negative for hearing loss, lump/mass and trouble swallowing.   Eyes:  Negative for eye problems and icterus.  Respiratory:  Negative for chest tightness, cough and shortness of breath.   Cardiovascular:  Negative for chest pain, leg swelling and palpitations.  Gastrointestinal:  Negative for abdominal  distention, abdominal pain, constipation, diarrhea, nausea and vomiting.  Endocrine: Negative for hot flashes.  Genitourinary:  Negative for difficulty urinating.   Musculoskeletal:  Negative for arthralgias.  Skin:  Negative for itching and rash.  Neurological:  Negative for dizziness, extremity weakness, headaches and numbness.  Hematological:  Negative for adenopathy. Does not bruise/bleed easily.  Psychiatric/Behavioral:  Negative for depression. The patient is not nervous/anxious.       PHYSICAL EXAMINATION    Vitals:   12/06/23 1108  BP: (!) 139/57  Pulse: 76  Resp: 18  Temp: 98.5 F (36.9 C)  SpO2: 98%    Physical Exam Constitutional:      General: She is not in acute distress.    Appearance: Normal appearance. She is not toxic-appearing.  HENT:     Head: Normocephalic and atraumatic.     Mouth/Throat:     Mouth: Mucous membranes are moist.     Pharynx: Oropharynx is clear. No oropharyngeal exudate or posterior oropharyngeal erythema.  Eyes:     General: No scleral icterus. Cardiovascular:     Rate and Rhythm: Normal rate and regular rhythm.     Pulses: Normal pulses.     Heart sounds: Normal heart sounds.  Pulmonary:     Effort: Pulmonary effort is normal.     Breath sounds: Normal breath sounds.  Abdominal:     General: Abdomen is flat. Bowel sounds are normal. There is no distension.     Palpations: Abdomen is soft.     Tenderness: There is no abdominal tenderness.  Musculoskeletal:        General: No swelling.     Cervical back: Neck supple.  Lymphadenopathy:     Cervical: No cervical adenopathy.  Skin:    General: Skin is warm and dry.     Findings: No rash.  Neurological:     General: No focal deficit present.     Mental Status: She is alert.  Psychiatric:        Mood and Affect: Mood normal.        Behavior: Behavior normal.     LABORATORY DATA:  CBC    Component Value Date/Time   WBC 7.9 12/06/2023 1007   WBC 15.9 (H) 08/02/2021  1217   RBC 5.53 (H) 12/06/2023 1007   HGB 15.9 (H) 12/06/2023 1007   HCT 46.9 (H) 12/06/2023 1007   PLT 398 12/06/2023 1007   MCV 84.8 12/06/2023 1007   MCH 28.8 12/06/2023 1007   MCHC 33.9 12/06/2023 1007   RDW 14.2 12/06/2023 1007   LYMPHSABS 1.7 12/06/2023 1007   MONOABS 0.7 12/06/2023 1007   EOSABS 0.3 12/06/2023 1007   BASOSABS 0.1 12/06/2023 1007       ASSESSMENT and THERAPY PLAN:   Polycythemia vera (HCC) Lab review: 05/31/2022: Hemoglobin 15.8, hematocrit 46.9, WBC 7.4, platelets 365 06/14/2023: Hemoglobin 15.7, hematocrit 45.8  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  Treatment: Phlebotomy Oct/2024 (only half a unit could be  removed because of symptoms from patient with nausea)  Goal: Symptom control and to keep hemoglobin less than 18 Treatment plan: Check labs and phlebotomy as needed   Elevated hemoglobin Monitored for elevated hemoglobin, currently 15.9, below phlebotomy threshold. Asymptomatic, no phlebotomy due to previous adverse effects. Medication considered but may affect other blood counts. - Monitor hemoglobin every three months. - Avoid phlebotomy unless hemoglobin exceeds 18. - Discuss medication if hemoglobin increases, considering impact on other blood counts.  Cardiac issues (enlarged heart and heart murmur) Enlarged heart and murmur contribute to fatigue. Under cardiologist care, advised to increase physical activity. - Encourage daily walking, starting at 5 minutes, increasing as tolerated. - Continue cardiology follow-up.  RTC in 3-4 months or sooner if needed.    All questions were answered. The patient knows to call the clinic with any problems, questions or concerns. We can certainly see the patient much sooner if necessary.  Total encounter time:20 minutes*in face-to-face visit time, chart review, lab review, care coordination, order entry, and documentation of the encounter  time.    Lillard Anes, NP 12/09/23 12:18 AM Medical Oncology and Hematology [facility] [address] [place], [place] [address] Tel. [phone]    Fax. [phone]  *Total Encounter Time as defined by the Centers for Medicare and Medicaid Services includes, in addition to the face-to-face time of a patient visit (documented in the note above) non-face-to-face time: obtaining and reviewing outside history, ordering and reviewing medications, tests or procedures, care coordination (communications with other health care professionals or caregivers) and documentation in the medical record.

## 2023-12-06 NOTE — Patient Instructions (Signed)
 Polycythemia Vera  Polycythemia vera (PV) is a form of blood cancer called a myeloproliferative neoplasm (MPN) in which the bone marrow makes too many red blood cells. The bone marrow may also make too many clotting cells (platelets) and white blood cells. Bone marrow is the spongy center of bones where blood cells are produced. Sometimes, this causes an overproduction of blood cells in the liver and spleen, causing those organs to become enlarged. Additionally, people who have PV are at a higher risk for stroke or heart attack because their blood may clot more easily. PV is a long-term, or chronic, disease. What are the causes? Almost all people who have PV have an abnormal gene (genetic mutation) that causes changes in the way that the bone marrow makes blood cells. This gene, which is called JAK2, is not hereditary. This means that it is not passed along from parent to child. It is not known what triggers the genetic mutation that causes the body to produce too many red blood cells. What increases the risk? You are more likely to develop this condition if: You are female. You are 71 years of age or older. What are the signs or symptoms? You may not have any symptoms in the early stage of PV. When symptoms develop, they may include: Itchy skin, especially after bathing. Burning sensation in the hands or feet. Abdominal fullness or feeling full soon after eating. Ulcers on fingers or toes. Shortness of breath. Bone or joint pain. Headache. Tiredness. Ringing in the ears. Weight loss. Unusual bleeding, including nosebleeds or bleeding from gums. How is this diagnosed? This condition may be diagnosed during a routine physical exam and a blood test called a complete blood count (CBC). Your health care provider may also suspect PV if you have symptoms. During the physical exam, your health care provider may find that you have an enlarged liver or spleen. You may also have tests to confirm the  diagnosis. These may include: A procedure to remove a sample of bone marrow for testing (bone marrow biopsy). Blood tests to check for: The JAK2 gene. Low levels of a hormone that helps to regulate red blood cell production (erythropoietin). How is this treated? There is no cure for PV, but treatment can help to control the disease. There are several types of treatment. No single treatment works for everyone. You will need to work with a blood cancer specialist (hematologist) to find the treatment that is best for you. This condition may be treated by: Periodically having some blood removed with a needle (phlebotomy) to lower the number of red blood cells. Taking medicine. Your health care provider may recommend: Low-dose aspirin to lower your risk for blood clots. A medicine to control blood counts. A medicine that slows down the effects of JAK2 gene. Other medicines to treat symptoms such as itching. Follow these instructions at home:  Take over-the-counter and prescription medicines only as told by your health care provider. Do regular exercise as told by your health care provider. Check your hands and feet regularly for any sores that do not heal. Do not use any products that contain nicotine or tobacco. These products include cigarettes, chewing tobacco, and vaping devices, such as e-cigarettes. If you need help quitting, ask your health care provider. Return to your normal activities as told by your health care provider. Ask your health care provider what activities are safe for you. Keep all follow-up visits. This is important. Contact a health care provider if: You have side  effects from your medicines. Your symptoms change or get worse at home. You have blood in your stool or you vomit blood. Get help right away if: You have sudden and severe pain in your abdomen. You have chest pain or difficulty breathing. You have any symptoms of a stroke. "BE FAST" is an easy way to remember  the main warning signs of a stroke: B - Balance. Signs are dizziness, sudden trouble walking, or loss of balance. E - Eyes. Signs are trouble seeing or a sudden change in vision. F - Face. Signs are sudden weakness or numbness of the face, or the face or eyelid drooping on one side. A - Arms. Signs are weakness or numbness in an arm. This happens suddenly and usually on one side of the body. S - Speech. Signs are sudden trouble speaking, slurred speech, or trouble understanding what people say. T - Time. Time to call emergency services. Write down what time symptoms started. You have other signs of a stroke, such as: A sudden, severe headache with no known cause. Nausea or vomiting. Seizure. These symptoms may be an emergency. Get help right away. Call 911. Do not wait to see if the symptoms will go away. Do not drive yourself to the hospital. Summary Polycythemia vera is a form of blood cancer in which the bone marrow makes too many red blood cells. People who have polycythemia vera are at a higher risk for stroke or heart attack because their blood may clot more easily. The disease is most often associated with an abnormal gene (genetic mutation) that causes changes in the way that the bone marrow makes blood cells. There is no cure for PV, but treatment can help to control the disease. It is treated by periodically having some blood removed and by taking medicines. This information is not intended to replace advice given to you by your health care provider. Make sure you discuss any questions you have with your health care provider. Document Revised: 08/22/2021 Document Reviewed: 08/22/2021 Elsevier Patient Education  2024 [employer].

## 2023-12-09 ENCOUNTER — Encounter: Payer: Self-pay | Admitting: Hematology and Oncology

## 2023-12-09 NOTE — Assessment & Plan Note (Signed)
 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  Treatment: Phlebotomy Oct/2024 (only half a unit could be removed because of symptoms from patient with nausea)  Goal: Symptom control and to keep hemoglobin less than 18 Treatment plan: Check labs and phlebotomy as needed   Elevated hemoglobin Monitored for elevated hemoglobin, currently 15.9, below phlebotomy threshold. Asymptomatic, no phlebotomy due to previous adverse effects. Medication considered but may affect other blood counts. - Monitor hemoglobin every three months. - Avoid phlebotomy unless hemoglobin exceeds 18. - Discuss medication if hemoglobin increases, considering impact on other blood counts.  Cardiac issues (enlarged heart and heart murmur) Enlarged heart and murmur contribute to fatigue. Under cardiologist care, advised to increase physical activity. - Encourage daily walking, starting at 5 minutes, increasing as tolerated. - Continue cardiology follow-up.  RTC in 3-4 months or sooner if needed.

## 2024-01-17 ENCOUNTER — Other Ambulatory Visit: Payer: Self-pay

## 2024-01-17 ENCOUNTER — Ambulatory Visit (HOSPITAL_BASED_OUTPATIENT_CLINIC_OR_DEPARTMENT_OTHER)
Admission: RE | Admit: 2024-01-17 | Discharge: 2024-01-17 | Disposition: A | Discharge status: Home / Self Care | Source: Ambulatory Visit | Attending: Cardiology | Admitting: Cardiology

## 2024-01-17 ENCOUNTER — Emergency Department (HOSPITAL_COMMUNITY)

## 2024-01-17 ENCOUNTER — Encounter (HOSPITAL_COMMUNITY): Payer: Self-pay | Admitting: Emergency Medicine

## 2024-01-17 ENCOUNTER — Observation Stay (HOSPITAL_COMMUNITY)
Admission: EM | Admit: 2024-01-17 | Discharge: 2024-01-19 | DRG: 872 | Disposition: A | Discharge status: Home / Self Care | Attending: Internal Medicine | Admitting: Internal Medicine

## 2024-01-17 DIAGNOSIS — E785 Hyperlipidemia, unspecified: Secondary | ICD-10-CM | POA: Diagnosis present

## 2024-01-17 DIAGNOSIS — A419 Sepsis, unspecified organism: Principal | ICD-10-CM | POA: Diagnosis present

## 2024-01-17 DIAGNOSIS — M7989 Other specified soft tissue disorders: Secondary | ICD-10-CM | POA: Diagnosis not present

## 2024-01-17 DIAGNOSIS — R011 Cardiac murmur, unspecified: Secondary | ICD-10-CM

## 2024-01-17 DIAGNOSIS — L539 Erythematous condition, unspecified: Principal | ICD-10-CM

## 2024-01-17 DIAGNOSIS — I10 Essential (primary) hypertension: Secondary | ICD-10-CM | POA: Diagnosis present

## 2024-01-17 DIAGNOSIS — D45 Polycythemia vera: Secondary | ICD-10-CM | POA: Diagnosis present

## 2024-01-17 DIAGNOSIS — L02519 Cutaneous abscess of unspecified hand: Secondary | ICD-10-CM | POA: Diagnosis present

## 2024-01-17 DIAGNOSIS — L03113 Cellulitis of right upper limb: Secondary | ICD-10-CM | POA: Diagnosis not present

## 2024-01-17 DIAGNOSIS — Z79890 Hormone replacement therapy (postmenopausal): Secondary | ICD-10-CM

## 2024-01-17 DIAGNOSIS — Z853 Personal history of malignant neoplasm of breast: Secondary | ICD-10-CM

## 2024-01-17 DIAGNOSIS — Z7982 Long term (current) use of aspirin: Secondary | ICD-10-CM

## 2024-01-17 DIAGNOSIS — Z79899 Other long term (current) drug therapy: Secondary | ICD-10-CM

## 2024-01-17 DIAGNOSIS — E039 Hypothyroidism, unspecified: Secondary | ICD-10-CM | POA: Diagnosis present

## 2024-01-17 DIAGNOSIS — Z90710 Acquired absence of both cervix and uterus: Secondary | ICD-10-CM

## 2024-01-17 DIAGNOSIS — E872 Acidosis, unspecified: Secondary | ICD-10-CM | POA: Diagnosis present

## 2024-01-17 DIAGNOSIS — L03119 Cellulitis of unspecified part of limb: Secondary | ICD-10-CM | POA: Diagnosis present

## 2024-01-17 DIAGNOSIS — Z8249 Family history of ischemic heart disease and other diseases of the circulatory system: Secondary | ICD-10-CM

## 2024-01-17 DIAGNOSIS — E876 Hypokalemia: Secondary | ICD-10-CM | POA: Diagnosis not present

## 2024-01-17 DIAGNOSIS — Z66 Do not resuscitate: Secondary | ICD-10-CM | POA: Diagnosis present

## 2024-01-17 DIAGNOSIS — I517 Cardiomegaly: Secondary | ICD-10-CM | POA: Insufficient documentation

## 2024-01-17 DIAGNOSIS — Z9011 Acquired absence of right breast and nipple: Secondary | ICD-10-CM

## 2024-01-17 DIAGNOSIS — Z1152 Encounter for screening for COVID-19: Secondary | ICD-10-CM

## 2024-01-17 LAB — CBC WITH DIFFERENTIAL/PLATELET
Abs Immature Granulocytes: 0.07 10*3/uL (ref 0.00–0.07)
Basophils Absolute: 0 10*3/uL (ref 0.0–0.1)
Basophils Relative: 0 %
Eosinophils Absolute: 0.1 10*3/uL (ref 0.0–0.5)
Eosinophils Relative: 0 %
HCT: 48.7 % — ABNORMAL HIGH (ref 36.0–46.0)
Hemoglobin: 16.5 g/dL — ABNORMAL HIGH (ref 12.0–15.0)
Immature Granulocytes: 0 %
Lymphocytes Relative: 5 %
Lymphs Abs: 0.7 10*3/uL (ref 0.7–4.0)
MCH: 28.7 pg (ref 26.0–34.0)
MCHC: 33.9 g/dL (ref 30.0–36.0)
MCV: 84.7 fL (ref 80.0–100.0)
Monocytes Absolute: 0.9 10*3/uL (ref 0.1–1.0)
Monocytes Relative: 6 %
Neutro Abs: 14 10*3/uL — ABNORMAL HIGH (ref 1.7–7.7)
Neutrophils Relative %: 89 %
Platelets: 370 10*3/uL (ref 150–400)
RBC: 5.75 MIL/uL — ABNORMAL HIGH (ref 3.87–5.11)
RDW: 14.6 % (ref 11.5–15.5)
WBC: 15.8 10*3/uL — ABNORMAL HIGH (ref 4.0–10.5)
nRBC: 0 % (ref 0.0–0.2)

## 2024-01-17 LAB — RESP PANEL BY RT-PCR (RSV, FLU A&B, COVID)  RVPGX2
Influenza A by PCR: NEGATIVE
Influenza B by PCR: NEGATIVE
Resp Syncytial Virus by PCR: NEGATIVE
SARS Coronavirus 2 by RT PCR: NEGATIVE

## 2024-01-17 LAB — COMPREHENSIVE METABOLIC PANEL WITH GFR
ALT: 22 U/L (ref 0–44)
AST: 30 U/L (ref 15–41)
Albumin: 4.3 g/dL (ref 3.5–5.0)
Alkaline Phosphatase: 84 U/L (ref 38–126)
Anion gap: 14 (ref 5–15)
BUN: 21 mg/dL (ref 8–23)
CO2: 29 mmol/L (ref 22–32)
Calcium: 9.7 mg/dL (ref 8.9–10.3)
Chloride: 99 mmol/L (ref 98–111)
Creatinine, Ser: 1.01 mg/dL — ABNORMAL HIGH (ref 0.44–1.00)
GFR, Estimated: 57 mL/min — ABNORMAL LOW (ref >60)
Glucose, Bld: 141 mg/dL — ABNORMAL HIGH (ref 70–99)
Potassium: 3.5 mmol/L (ref 3.5–5.1)
Sodium: 142 mmol/L (ref 135–145)
Total Bilirubin: 2.2 mg/dL — ABNORMAL HIGH (ref 0.0–1.2)
Total Protein: 7.1 g/dL (ref 6.5–8.1)

## 2024-01-17 LAB — ECHOCARDIOGRAM COMPLETE
AR max vel: 1.05 cm2
AV Area VTI: 1.17 cm2
AV Area mean vel: 1.09 cm2
AV Mean grad: 8 mmHg
AV Peak grad: 15.8 mmHg
Ao pk vel: 1.99 m/s
Area-P 1/2: 4.89 cm2
MV M vel: 0.86 m/s
MV Peak grad: 3 mmHg
S' Lateral: 1.85 cm

## 2024-01-17 LAB — URINALYSIS, W/ REFLEX TO CULTURE (INFECTION SUSPECTED)
Bacteria, UA: NONE SEEN
Bilirubin Urine: NEGATIVE
Glucose, UA: NEGATIVE mg/dL
Hgb urine dipstick: NEGATIVE
Ketones, ur: NEGATIVE mg/dL
Leukocytes,Ua: NEGATIVE
Nitrite: NEGATIVE
Protein, ur: NEGATIVE mg/dL
Specific Gravity, Urine: 1.014 (ref 1.005–1.030)
pH: 6 (ref 5.0–8.0)

## 2024-01-17 LAB — LACTIC ACID, PLASMA: Lactic Acid, Venous: 3 mmol/L (ref 0.5–1.9)

## 2024-01-17 LAB — I-STAT CG4 LACTIC ACID, ED
Lactic Acid, Venous: 2.5 mmol/L (ref 0.5–1.9)
Lactic Acid, Venous: 2.7 mmol/L (ref 0.5–1.9)

## 2024-01-17 LAB — PROTIME-INR
INR: 1 (ref 0.8–1.2)
Prothrombin Time: 13.7 s (ref 11.4–15.2)

## 2024-01-17 LAB — TROPONIN I (HIGH SENSITIVITY): Troponin I (High Sensitivity): 6 ng/L (ref <18)

## 2024-01-17 MED ORDER — POTASSIUM CHLORIDE CRYS ER 20 MEQ PO TBCR
20.0000 meq | EXTENDED_RELEASE_TABLET | Freq: Every day | ORAL | Status: DC
  Administered 2024-01-17: 20 meq via ORAL
  Filled 2024-01-17: qty 1

## 2024-01-17 MED ORDER — SODIUM CHLORIDE 0.9 % IV SOLN: INTRAVENOUS | Status: AC

## 2024-01-17 MED ORDER — SODIUM CHLORIDE 0.9 % IV SOLN
2.0000 g | Freq: Once | INTRAVENOUS | Status: AC
  Administered 2024-01-17: 2 g via INTRAVENOUS
  Filled 2024-01-17: qty 20

## 2024-01-17 MED ORDER — BISACODYL 5 MG PO TBEC: 5.0000 mg | DELAYED_RELEASE_TABLET | Freq: Every day | ORAL | Status: DC | PRN

## 2024-01-17 MED ORDER — CHLORTHALIDONE 25 MG PO TABS
25.0000 mg | ORAL_TABLET | Freq: Every day | ORAL | Status: DC
  Filled 2024-01-17: qty 1

## 2024-01-17 MED ORDER — CEFAZOLIN SODIUM-DEXTROSE 2-4 GM/100ML-% IV SOLN
2.0000 g | Freq: Three times a day (TID) | INTRAVENOUS | Status: DC
  Administered 2024-01-17 – 2024-01-19 (×5): 2 g via INTRAVENOUS
  Filled 2024-01-17 (×5): qty 100

## 2024-01-17 MED ORDER — SIMVASTATIN 20 MG PO TABS
20.0000 mg | ORAL_TABLET | Freq: Every day | ORAL | Status: DC
  Administered 2024-01-18 – 2024-01-19 (×2): 20 mg via ORAL
  Filled 2024-01-17 (×2): qty 1

## 2024-01-17 MED ORDER — ACETAMINOPHEN 325 MG PO TABS: 650.0000 mg | ORAL_TABLET | Freq: Four times a day (QID) | ORAL | Status: DC | PRN

## 2024-01-17 MED ORDER — ONDANSETRON HCL 4 MG PO TABS: 4.0000 mg | ORAL_TABLET | Freq: Four times a day (QID) | ORAL | Status: DC | PRN

## 2024-01-17 MED ORDER — LACTATED RINGERS IV BOLUS (SEPSIS)
1000.0000 mL | Freq: Once | INTRAVENOUS | Status: AC
  Administered 2024-01-17: 999 mL via INTRAVENOUS

## 2024-01-17 MED ORDER — ASPIRIN 81 MG PO TBEC
81.0000 mg | DELAYED_RELEASE_TABLET | Freq: Every day | ORAL | Status: DC
  Administered 2024-01-18 – 2024-01-19 (×2): 81 mg via ORAL
  Filled 2024-01-17 (×2): qty 1

## 2024-01-17 MED ORDER — DIPHENHYDRAMINE HCL 25 MG PO CAPS
25.0000 mg | ORAL_CAPSULE | Freq: Four times a day (QID) | ORAL | Status: DC | PRN
Start: 2024-01-17 — End: 2024-01-19

## 2024-01-17 MED ORDER — ONDANSETRON HCL 4 MG/2ML IJ SOLN: 4.0000 mg | Freq: Four times a day (QID) | INTRAMUSCULAR | Status: DC | PRN

## 2024-01-17 MED ORDER — LEVOTHYROXINE SODIUM 100 MCG PO TABS
100.0000 ug | ORAL_TABLET | Freq: Every day | ORAL | Status: DC
  Administered 2024-01-18 – 2024-01-19 (×2): 100 ug via ORAL
  Filled 2024-01-17 (×2): qty 1

## 2024-01-17 MED ORDER — SENNOSIDES-DOCUSATE SODIUM 8.6-50 MG PO TABS: 1.0000 | ORAL_TABLET | Freq: Every evening | ORAL | Status: DC | PRN

## 2024-01-17 MED ORDER — LACTATED RINGERS IV BOLUS
1000.0000 mL | Freq: Once | INTRAVENOUS | Status: AC
Start: 2024-01-17 — End: 2024-01-17
  Administered 2024-01-17: 999 mL via INTRAVENOUS

## 2024-01-17 MED ORDER — ACETAMINOPHEN 650 MG RE SUPP: 650.0000 mg | Freq: Four times a day (QID) | RECTAL | Status: DC | PRN

## 2024-01-17 MED ORDER — ENOXAPARIN SODIUM 40 MG/0.4ML IJ SOSY
40.0000 mg | PREFILLED_SYRINGE | INTRAMUSCULAR | Status: DC
  Administered 2024-01-17 – 2024-01-18 (×2): 40 mg via SUBCUTANEOUS
  Filled 2024-01-17 (×2): qty 0.4

## 2024-01-17 NOTE — ED Notes (Signed)
 Received report from [place], Charity fundraiser.

## 2024-01-17 NOTE — ED Provider Notes (Signed)
  Physical Exam  BP (!) 141/64   Pulse (!) 109   Temp 98.1 F (36.7 C) (Oral)   Resp 18   SpO2 97%   Physical Exam Vitals and nursing note reviewed.  Constitutional:      General: She is not in acute distress.    Appearance: Normal appearance.  Cardiovascular:     Rate and Rhythm: Regular rhythm. Tachycardia present.  Pulmonary:     Effort: Pulmonary effort is normal. No respiratory distress.  Abdominal:     General: There is no distension.  Skin:    General: Skin is warm.     Findings: Erythema present.  Neurological:     General: No focal deficit present.     Mental Status: Mental status is at baseline.     Procedures  Procedures  ED Course / MDM   Clinical Course as of 01/17/24 1508  Fri Jan 17, 2024  1502 Possible cellulitis, nontender present for past couple weeks. Treated with outpatient abx, possibly bactrim. Presented with tachycardia. No chest pain or dyspnea. Likely admission for sepsis. Cultures were not able to be obtained even after evaluation by IV team. Awaiting DVT workup. Doing better with fluids and provided bactrim.  [CB]    Clinical Course User Index [CB] Hayes Lipps, PA-C   Medical Decision Making Amount and/or Complexity of Data Reviewed Labs: ordered. Radiology: ordered.   Patient care handed over from [facility], [place].  At time of handoff, awaiting DVT study with intention to admit for sepsis.  Patient had been treated for cellulitis on right arm for the last 2 or 3 weeks with antibiotics, suspecting it to be Bactrim, however patient is unsure what antibiotic she took.  Picture in chart.  States that symptoms had improved but are worse today, however are nontender.  And also was known to be tachycardic upon arrival.  Lactic acid of 2.7 and WBC of 15.8 with tachycardia, sepsis positive.  Full ROM.Radial pulse 2+.  Negative for DVT, SVT.  With being positive for sepsis criteria likely secondary to cellulitis, will seek to admit.   Hospitalist was consulted who accepted patient.  Patient care was then transferred over at that time to Dr. Edith Gores.       Hayes Lipps, [place] 01/17/24 [place]    Wynetta Heckle, MD 01/17/24 [phone]

## 2024-01-17 NOTE — Progress Notes (Signed)
 VASCULAR LAB    Right upper extremity venous duplex has been performed.  See CV proc for preliminary results.  Relayed negative results to [facility], PA-C  Tanya Marvin, RVT 01/17/2024, 6:01 PM

## 2024-01-17 NOTE — H&P (Signed)
 History and Physical    Donna Kirk  PCP: Candiss Chamorro, MD  Patient coming from: Home  I have personally briefly reviewed patient's old medical records in [facility]  Chief Complaint: Right upper extremity redness  HPI: Donna Kirk is a 78 y.o. female with medical history significant for HTN, HLD, hypothyroidism, polycythemia vera, asymmetric septal hypertrophy, history of breast cancer who presented to the ED for evaluation of right upper extremity erythema.  Patient states that about 3-4 weeks ago she noticed redness involving her right arm from mid biceps area to halfway down the forearm.  She saw her PCP who started her on a course of antibiotics.  Patient states she has no idea what the antibiotic was but she did not completed.  She did see initial improvement but not complete resolution.  She says 2 days ago she noticed increased erythema to her right anterior arm again extending from mid biceps area now up to the wrist but sparing the hand.  Area has had increased warmth to touch.  She has had some mild pruritus to the region.  She has not seen any open wounds, discharge, vesicles/bullae, or sloughing of skin.  She has not had any new numbness/tingling or decrease in strength or grip.  She denies any injury to the area.  She does not recall any insect or spider bites.  She does not recall any similar episodes to this in the past but does note she had lymphedema of the arm when she was dealing with breast cancer that subsequently resolved.  Patient went for echocardiogram earlier today and was noted to be tachycardic.  She was advised to come to the ED for further evaluation.  ED Course  Labs/Imaging on admission: I have personally reviewed following labs and imaging studies.  Initial vitals show BP 157/81, pulse 131, RR 16, temp 98.1 F, SpO2 98% on room air.  Labs showed WBC 15.8, hemoglobin 16.5, platelets  370, sodium 142, potassium 3.5, bicarb 29, BUN 21, creatinine 1.01, serum glucose 141, total bilirubin 2.2, troponin 6.  Lactic acid 2.5 > 2.7 > 3.0.  SARS-CoV-2, influenza, RSV PCR negative.  Blood cultures in process.  2 view chest x-ray negative for focal consolidation, edema, effusion.  2 view chest x-ray negative for focal consolidation, effusion, edema.  RUE DVT study preliminary read negative for evidence of DVT or superficial vein thrombosis.  Patient was given 3 L LR and IV ceftriaxone.  The hospitalist service was consulted to admit.  Review of Systems: All systems reviewed and are negative except as documented in history of present illness above.   Past Medical History:  Diagnosis Date   Breast cancer (HCC)    Hypertension    Hypothyroidism    Polycythemia vera (HCC) 07/10/2022    Past Surgical History:  Procedure Laterality Date   ABDOMINAL HYSTERECTOMY     BREAST CYST EXCISION  07/03/2012   BREAST SURGERY  1993   right mastectomy-nodes removed   COLONOSCOPY     x2   DE QUERVAIN'S RELEASE  2009   right   DE QUERVAIN'S RELEASE  2006   left   MASTOPEXY  07/03/2012   Procedure: MASTOPEXY;  Surgeon: Marilou Showman, DO;  Location: [facility];  Service: Plastics;  Laterality: Left;  left breast mastopexy reduction, left lateral breast excision with liposuction for symmetry   TUBAL LIGATION      Social History: Social History   Tobacco Use  Smoking status: Never   Smokeless tobacco: Never  Substance Use Topics   Alcohol use: No   Drug use: No   No Known Allergies  Family History  Problem Relation Age of Onset   CAD Father      Prior to Admission medications   Medication Sig Start Date End Date Taking? Authorizing Provider  aspirin EC 81 MG tablet Take 81 mg by mouth daily. Swallow whole.   Yes [provider]  CALCIUM PO Take 1 tablet by mouth daily.   Yes [provider]  chlorthalidone  (HYGROTON ) 25 MG tablet Take 1  tablet (25 mg total) by mouth daily. 04/29/23  Yes Eilleen Grates, MD  Cholecalciferol  (VITAMIN D -3 PO) Take 1 capsule by mouth daily.   Yes [provider]  levothyroxine  (SYNTHROID ) 100 MCG tablet Take 100 mcg by mouth daily before breakfast. 09/13/23  Yes [provider]  Potassium Chloride  ER 20 MEQ TBCR Take 20 mEq by mouth daily with breakfast. 10/30/23  Yes [provider]  simvastatin  (ZOCOR ) 20 MG tablet Take 20 mg by mouth daily. 06/05/15  Yes [provider]    Physical Exam: Vitals:   01/17/24 1745 01/17/24 1800 01/17/24 1830 01/17/24 1900  BP: 125/64 139/72 106/70 (!) 134/99  Pulse: 100 100 95 (!) 104  Resp: (!) 24 (!) 26 (!) 23 17  Temp:      TempSrc:      SpO2: 97% 100% 96% 100%   Constitutional: Resting in bed, NAD, calm, comfortable Eyes: EOMI, lids and conjunctivae normal ENMT: Mucous membranes are moist. Posterior pharynx clear of any exudate or lesions.Normal dentition.  Neck: normal, supple, no masses. Respiratory: clear to auscultation bilaterally, no wheezing, no crackles. Normal respiratory effort. No accessory muscle use.  Cardiovascular: Regular rate and rhythm, systolic murmur. No lower extremity edema. 2+ radial and pedal pulses. Abdomen: no tenderness, no masses palpated. Musculoskeletal: no clubbing / cyanosis. No joint deformity upper and lower extremities. Good ROM, no contractures. Normal muscle tone.  Skin: Erythema anterior RUE from midway biceps region extending distally up to the wrist as pictured below.  No open wounds, vesicles, bullae, or scaly rash.  Area has increased warmth to touch. Neurologic: Sensation intact. Strength 5/5 in all 4.  Psychiatric: Normal judgment and insight. Alert and oriented x 3. Normal mood.    EKG: Personally reviewed. Sinus tachycardia, rate 110, RBBB.  Rate is faster when compared to prior.  Assessment/Plan Principal Problem:   Cellulitis of right upper extremity Active Problems:    HTN (hypertension)   Polycythemia vera (HCC)   Hypothyroidism   Hyperlipidemia   Donna Kirk is a 78 y.o. female with medical history significant for HTN, HLD, hypothyroidism, polycythemia vera, asymmetric septal hypertrophy who is admitted with cellulitis of right upper extremity.  Assessment and Plan: Cellulitis of right upper extremity: Recurred after treatment with unknown antibiotic a few weeks ago in the outpatient setting.  No open wounds, discharge, bullae.  RUE DVT study is negative.  Leukocytosis present. - Continue IV Ancef   Lactic acidosis: Mild lactic acidosis in setting of cellulitis without hypotension or evidence of septic shock.  Continue IV fluid hydration with normal saline and recheck lactic acid level.  Hypertension: Continue chlorthalidone .  Hyperlipidemia: Continue simvastatin .  Hypothyroidism: Continue Synthroid .  Polycythemia vera: Follows with hematology for intermittent phlebotomy if Hgb >18.  Continue aspirin 81 mg daily.  Asymmetric septal hypertrophy: Follows with cardiology.  Had repeat echocardiogram prior to admission today which showed EF 60-65%, mild  concentric LVH, G1DD.   DVT prophylaxis: enoxaparin (LOVENOX) injection 40 mg Start: 01/17/24 1915 Code Status:   Code Status: Limited: Do not attempt resuscitation (DNR) -DNR-LIMITED -Do Not Intubate/DNI   confirmed with patient on admission. Family Communication: Discussed with patient, she has discussed with family Disposition Plan: From home and likely discharge to home pending clinical progress Consults called: None Severity of Illness: The appropriate patient status for this patient is OBSERVATION. Observation status is judged to be reasonable and necessary in order to provide the required intensity of service to ensure the patient's safety. The patient's presenting symptoms, physical exam findings, and initial radiographic and laboratory data in the context of their medical condition is  felt to place them at decreased risk for further clinical deterioration. Furthermore, it is anticipated that the patient will be medically stable for discharge from the hospital within 2 midnights of admission.   Edith Gores MD [facility]  If 7PM-7AM, please contact night-coverage [email or web address]  Kirk, 7:23 PM

## 2024-01-17 NOTE — Sepsis Progress Note (Signed)
 Code Sepsis protocol being monitored by eLink.

## 2024-01-17 NOTE — ED Provider Notes (Signed)
 [place] EMERGENCY DEPARTMENT AT [facility] Provider Note   CSN: [id number] Arrival date & time: 01/17/24  1200     History  Chief Complaint  Patient presents with   Arm Swelling   Tachycardia    KELVIN BUSTILLOS is a 78 y.o. female history of hypertension, hypothyroidism, breast cancer presents with complaints of right arm swelling and redness.  This has been ongoing for the past 2 weeks.  Had been seen Friday her primary care doctor and placed on antibiotic.  Unclear what this is at this time.  She noted temporary improvement before her symptoms came back and have since worsened.  She denies any fevers or chills.  No pain to the upper extremity.  HPI    Past Medical History:  Diagnosis Date   Breast cancer (HCC)    Hypertension    Hypothyroidism      Home Medications Prior to Admission medications   Medication Sig Start Date End Date Taking? Authorizing Provider  aspirin EC 81 MG tablet Take 81 mg by mouth daily. Swallow whole.   Yes [provider]  CALCIUM PO Take 1 tablet by mouth daily.   Yes [provider]  chlorthalidone  (HYGROTON ) 25 MG tablet Take 1 tablet (25 mg total) by mouth daily. 04/29/23  Yes Eilleen Grates, MD  Cholecalciferol  (VITAMIN D -3 PO) Take 1 capsule by mouth daily.   Yes [provider]  levothyroxine  (SYNTHROID ) 100 MCG tablet Take 100 mcg by mouth daily before breakfast. 09/13/23  Yes [provider]  Potassium Chloride  ER 20 MEQ TBCR Take 20 mEq by mouth daily with breakfast. 10/30/23  Yes [provider]  simvastatin  (ZOCOR ) 20 MG tablet Take 20 mg by mouth daily. 06/05/15  Yes [provider]      Allergies    Patient has no known allergies.    Review of Systems   Review of Systems  Skin:  Positive for color change.    Physical Exam Updated Vital Signs BP (!) 141/64   Pulse (!) 109   Temp 98.1 F (36.7 C) (Oral)   Resp 18   SpO2 97%  Physical Exam Vitals and  nursing note reviewed.  Constitutional:      General: She is not in acute distress.    Appearance: She is well-developed.  HENT:     Head: Normocephalic and atraumatic.  Eyes:     Conjunctiva/sclera: Conjunctivae normal.  Cardiovascular:     Rate and Rhythm: Normal rate and regular rhythm.     Heart sounds: Murmur heard.  Pulmonary:     Effort: Pulmonary effort is normal. No respiratory distress.     Breath sounds: Normal breath sounds.  Abdominal:     Palpations: Abdomen is soft.     Tenderness: There is no abdominal tenderness.  Musculoskeletal:     Cervical back: Neck supple.     Comments: Right upper extremity with erythema, warmth and swelling, no tenderness, tolerates full range of motion of shoulder, elbow and wrist.,  Compartments are soft, radial pulses 2+  Skin:    General: Skin is warm and dry.     Capillary Refill: Capillary refill takes less than 2 seconds.  Neurological:     Mental Status: She is alert.  Psychiatric:        Mood and Affect: Mood normal.      ED Results / Procedures / Treatments   Labs (all labs ordered are listed, but only abnormal results are displayed) Labs Reviewed  COMPREHENSIVE METABOLIC PANEL WITH GFR - Abnormal; Notable for the following components:      Result Value   Glucose, Bld 141 (*)    Creatinine, Ser 1.01 (*)    Total Bilirubin 2.2 (*)    GFR, Estimated 57 (*)    All other components within normal limits  CBC WITH DIFFERENTIAL/PLATELET - Abnormal; Notable for the following components:   WBC 15.8 (*)    RBC 5.75 (*)    Hemoglobin 16.5 (*)    HCT 48.7 (*)    Neutro Abs 14.0 (*)    All other components within normal limits  I-STAT CG4 LACTIC ACID, ED - Abnormal; Notable for the following components:   Lactic Acid, Venous 2.5 (*)    All other components within normal limits  I-STAT CG4 LACTIC ACID, ED - Abnormal; Notable for the following components:   Lactic Acid, Venous 2.7 (*)    All other components within normal  limits  RESP PANEL BY RT-PCR (RSV, FLU A&B, COVID)  RVPGX2  CULTURE, BLOOD (ROUTINE X 2)  CULTURE, BLOOD (ROUTINE X 2)  PROTIME-INR  URINALYSIS, W/ REFLEX TO CULTURE (INFECTION SUSPECTED)  TROPONIN I (HIGH SENSITIVITY)    EKG EKG Interpretation Date/Time:  Friday Jan 17 2024 13:16:31 EDT Ventricular Rate:  110 PR Interval:  150 QRS Duration:  128 QT Interval:  356 QTC Calculation: 482 R Axis:   -55  Text Interpretation: Sinus tachycardia Right bundle branch block Inferior infarct, acute Anterior infarct, old Lateral leads are also involved No significant change since last tracing Confirmed by Albertus Hughs [phone]) on 01/17/2024 3:16:17 PM  Radiology DG Chest 2 View Result Date: 01/17/2024 CLINICAL DATA:  Tachycardia EXAM: CHEST - 2 VIEW COMPARISON:  Chest radiograph dated 08/03/2021 FINDINGS: Normal lung volumes. No focal consolidations. No pleural effusion or pneumothorax. The heart size and mediastinal contours are within normal limits. No acute osseous abnormality. Right axillary surgical clips. IMPRESSION: No active cardiopulmonary disease. Electronically Signed   By: Limin  Xu M.D.   On: 01/17/2024 14:14   ECHOCARDIOGRAM COMPLETE Result Date: 01/17/2024    ECHOCARDIOGRAM REPORT   Patient Name:   MONEY MADILL Date of Exam: 01/17/2024 Medical Rec #:  [id number]         Height:       63.0 in Accession #:    [id number]        Weight:       173.8 lb Date of Birth:  Jan 24, 1946         BSA:          1.822 m Patient Age:    78 years          BP:           136/82 mmHg Patient Gender: F                 HR:           107 bpm. Exam Location:  Outpatient Procedure: 2D Echo, Cardiac Doppler and Color Doppler (Both Spectral and Color            Flow Doppler were utilized during procedure). Indications:    Asymmetric septal hypertrophy [I51.7 (ICD-10-CM)]; Murmur,                 cardiac [R01.1 (ICD-10-CM)]  History:        Patient has prior history of Echocardiogram examinations, most                  recent  04/15/2019. Hypertrophic Cardiomyopathy, Arrythmias:RBBB;                 Risk Factors:Hypertension and Non-Smoker.  Sonographer:    Delford Felling MHA, RDMS, RVT, RDCS Referring Phys: [id number] [facility]  Sonographer Comments: Restricted mobility, tachycardia. IMPRESSIONS  1. Left ventricular ejection fraction, by estimation, is 60 to 65%. The left ventricle has normal function. The left ventricle has no regional wall motion abnormalities. There is mild concentric left ventricular hypertrophy. Left ventricular diastolic parameters are consistent with Grade I diastolic dysfunction (impaired relaxation).  2. Right ventricular systolic function is normal. The right ventricular size is normal. There is normal pulmonary artery systolic pressure.  3. The mitral valve is normal in structure. No evidence of mitral valve regurgitation. No evidence of mitral stenosis.  4. The aortic valve is tricuspid. There is mild calcification of the aortic valve. There is mild thickening of the aortic valve. Aortic valve regurgitation is not visualized. Aortic valve sclerosis/calcification is present, without any evidence of aortic stenosis. Aortic valve area, by VTI measures 1.17 cm. Aortic valve mean gradient measures 8.0 mmHg. Aortic valve Vmax measures 1.99 m/s.  5. The inferior vena cava is normal in size with greater than 50% respiratory variability, suggesting right atrial pressure of 3 mmHg. FINDINGS  Left Ventricle: Left ventricular ejection fraction, by estimation, is 60 to 65%. The left ventricle has normal function. The left ventricle has no regional wall motion abnormalities. The left ventricular internal cavity size was normal in size. There is  mild concentric left ventricular hypertrophy. Left ventricular diastolic parameters are consistent with Grade I diastolic dysfunction (impaired relaxation). Right Ventricle: The right ventricular size is normal. No increase in right ventricular wall thickness. Right  ventricular systolic function is normal. There is normal pulmonary artery systolic pressure. The tricuspid regurgitant velocity is 1.78 m/s, and  with an assumed right atrial pressure of 3 mmHg, the estimated right ventricular systolic pressure is 15.7 mmHg. Left Atrium: Left atrial size was normal in size. Right Atrium: Right atrial size was normal in size. Pericardium: There is no evidence of pericardial effusion. Mitral Valve: The mitral valve is normal in structure. No evidence of mitral valve regurgitation. No evidence of mitral valve stenosis. Tricuspid Valve: The tricuspid valve is normal in structure. Tricuspid valve regurgitation is trivial. No evidence of tricuspid stenosis. Aortic Valve: The aortic valve is tricuspid. There is mild calcification of the aortic valve. There is mild thickening of the aortic valve. Aortic valve regurgitation is not visualized. Aortic valve sclerosis/calcification is present, without any evidence of aortic stenosis. Aortic valve mean gradient measures 8.0 mmHg. Aortic valve peak gradient measures 15.8 mmHg. Aortic valve area, by VTI measures 1.17 cm. Pulmonic Valve: The pulmonic valve was normal in structure. Pulmonic valve regurgitation is not visualized. No evidence of pulmonic stenosis. Aorta: The aortic root is normal in size and structure. Venous: The inferior vena cava is normal in size with greater than 50% respiratory variability, suggesting right atrial pressure of 3 mmHg. IAS/Shunts: No atrial level shunt detected by color flow Doppler.  LEFT VENTRICLE PLAX 2D LVIDd:         3.35 cm LVIDs:         1.85 cm LV PW:         1.10 cm LV IVS:        1.10 cm LVOT diam:     1.63 cm LV SV:         32 LV SV Index:   18  LVOT Area:     2.09 cm  RIGHT VENTRICLE          IVC RV Basal diam:  2.80 cm  IVC diam: 2.16 cm RV Mid diam:    2.59 cm TAPSE (M-mode): 2.4 cm LEFT ATRIUM           Index        RIGHT ATRIUM           Index LA diam:      3.21 cm 1.76 cm/m   RA Area:     10.60  cm LA Vol (A4C): 21.7 ml 11.91 ml/m  RA Volume:   18.90 ml  10.38 ml/m  AORTIC VALVE AV Area (Vmax):    1.05 cm AV Area (Vmean):   1.09 cm AV Area (VTI):     1.17 cm AV Vmax:           198.67 cm/s AV Vmean:          129.000 cm/s AV VTI:            0.274 m AV Peak Grad:      15.8 mmHg AV Mean Grad:      8.0 mmHg LVOT Vmax:         99.90 cm/s LVOT Vmean:        67.600 cm/s LVOT VTI:          0.154 m LVOT/AV VTI ratio: 0.56  AORTA Ao Root diam: 2.85 cm Ao Asc diam:  3.45 cm MITRAL VALVE                TRICUSPID VALVE MV Area (PHT): 4.89 cm     TR Peak grad:   12.7 mmHg MV Decel Time: 155 msec     TR Vmax:        178.00 cm/s MR Peak grad: 3.0 mmHg MR Vmax:      86.00 cm/s    SHUNTS MV E velocity: 85.40 cm/s   Systemic VTI:  0.15 m MV A velocity: 140.00 cm/s  Systemic Diam: 1.63 cm MV E/A ratio:  0.61 Maudine Sos MD Electronically signed by Maudine Sos MD Signature Date/Time: 01/17/2024/1:34:10 PM    Final     Procedures Procedures    Medications Ordered in ED Medications  lactated ringers  bolus 1,000 mL (999 mLs Intravenous New Bag/Given 01/17/24 1504)    And  lactated ringers  bolus 1,000 mL (999 mLs Intravenous New Bag/Given 01/17/24 1505)  lactated ringers  bolus 1,000 mL (999 mLs Intravenous New Bag/Given 01/17/24 1342)  cefTRIAXone (ROCEPHIN) 2 g in sodium chloride  0.9 % 100 mL IVPB (2 g Intravenous New Bag/Given 01/17/24 1435)    ED Course/ Medical Decision Making/ A&P Clinical Course as of 01/17/24 1517  Fri Jan 17, 2024  1502 Possible cellulitis, nontender present for past couple weeks. Treated with outpatient abx, possibly bactrim. Presented with tachycardia. No chest pain or dyspnea. Likely admission for sepsis. Cultures were not able to be obtained even after evaluation by IV team. Awaiting DVT workup. Doing better with fluids and provided bactrim.  [CB]    Clinical Course User Index [CB] Hayes Lipps, PA-C                                 Medical Decision Making Amount  and/or Complexity of Data Reviewed Labs: ordered. Radiology: ordered.   This patient presents to the ED with chief complaint(s) of arm swelling.  The complaint  involves an extensive differential diagnosis and also carries with it a high risk of complications and morbidity.   Pertinent past medical history as listed in HPI  The differential diagnosis includes  Cellulitis/ erysipelas, necrotizing fasciitis, drug reaction, dermatitis, septic joint, gout Additional history obtained: Additional history obtained from spouse Records reviewed Care Everywhere/External Records  Assessment and management:   Patient presents tachycardic to 130 with complaints of right arm swelling, redness and increased warmth over the past 2 weeks.  Had been seen by her primary care doctor initially and started on a unknown antibiotic (chart review suggest Bactrim).  Had temporary improvement before symptoms came back and have since worsened.  No fevers or chills.  Denies any pain to the arm.  On exam there is notable erythema and warmth involving the volar aspect of the right upper extremity.  She tolerates full range of motion of elbow, wrist and shoulder without discomfort.  Do not suspect septic joint or gout.  No new medications.  Do not suspect drug reaction.  Denies any exposures to support dermatitis.  Noted to have a elevated white count and tachycardia.  Will start sepsis protocol.  ED pharmacist recommended Rocephin.  Will obtain right upper extremity Doppler to evaluate for DVT.  She denies any chest pain, shortness of breath or abdominal pain.  EKG demonstrates sinus tachycardia without ischemic changes.  Independent ECG interpretation:  Sinus tachycardia, right bundle branch block  Independent labs interpretation:  The following labs were independently interpreted:  CBC with leukocytosis of 15.8, CMP with elevated total bili to 2.2 (appears chronically elevated), lactic 2.7 *Unsuccessful in drawing blood  cultures  Independent visualization and interpretation of imaging: I independently visualized the following imaging with scope of interpretation limited to determining acute life threatening conditions related to emergency care:  Upper extremity venous Doppler pending   Consultations obtained:   None at this time  Disposition:   Signout given to: Nalani Ave, PA-C.  Please see his note for remainder of the visit.  Disposition pending workup.  Social Determinants of Health:   none  This note was dictated with voice recognition software.  Despite best efforts at proofreading, errors may have occurred which can change the documentation meaning.          Final Clinical Impression(s) / ED Diagnoses Final diagnoses:  Erythema    Rx / DC Orders ED Discharge Orders     None         Felicie Horning, PA-C 01/17/24 1517    Albertus Hughs, DO 01/17/24 1540

## 2024-01-17 NOTE — ED Triage Notes (Addendum)
 Pt presents with red, swollen and warm to touch right arm from upper arm to right wrist. Pt states it started 2 weeks ago went to PCP and was given antibiotics for 2 week and symptoms improved. 2 days ago redness and swelling return. Pt denies pain jbut is warm to touch. Pt went for routine visit to cardiology today and was tachy and told to come to ER.

## 2024-01-17 NOTE — ED Notes (Signed)
 Report given to [facility]

## 2024-01-17 NOTE — Hospital Course (Signed)
 Donna Kirk is a 78 y.o. female with medical history significant for HTN, HLD, hypothyroidism, polycythemia vera, asymmetric septal hypertrophy who is admitted with cellulitis of right upper extremity.

## 2024-01-18 DIAGNOSIS — E039 Hypothyroidism, unspecified: Secondary | ICD-10-CM | POA: Diagnosis present

## 2024-01-18 DIAGNOSIS — E785 Hyperlipidemia, unspecified: Secondary | ICD-10-CM | POA: Diagnosis present

## 2024-01-18 DIAGNOSIS — Z853 Personal history of malignant neoplasm of breast: Secondary | ICD-10-CM | POA: Diagnosis not present

## 2024-01-18 DIAGNOSIS — Z7982 Long term (current) use of aspirin: Secondary | ICD-10-CM | POA: Diagnosis not present

## 2024-01-18 DIAGNOSIS — A419 Sepsis, unspecified organism: Secondary | ICD-10-CM | POA: Diagnosis present

## 2024-01-18 DIAGNOSIS — Z9011 Acquired absence of right breast and nipple: Secondary | ICD-10-CM | POA: Diagnosis not present

## 2024-01-18 DIAGNOSIS — Z79890 Hormone replacement therapy (postmenopausal): Secondary | ICD-10-CM | POA: Diagnosis not present

## 2024-01-18 DIAGNOSIS — Z79899 Other long term (current) drug therapy: Secondary | ICD-10-CM | POA: Diagnosis not present

## 2024-01-18 DIAGNOSIS — D45 Polycythemia vera: Secondary | ICD-10-CM | POA: Diagnosis present

## 2024-01-18 DIAGNOSIS — Z8249 Family history of ischemic heart disease and other diseases of the circulatory system: Secondary | ICD-10-CM | POA: Diagnosis not present

## 2024-01-18 DIAGNOSIS — L539 Erythematous condition, unspecified: Secondary | ICD-10-CM | POA: Diagnosis present

## 2024-01-18 DIAGNOSIS — E876 Hypokalemia: Secondary | ICD-10-CM | POA: Diagnosis not present

## 2024-01-18 DIAGNOSIS — Z90710 Acquired absence of both cervix and uterus: Secondary | ICD-10-CM | POA: Diagnosis not present

## 2024-01-18 DIAGNOSIS — L03119 Cellulitis of unspecified part of limb: Secondary | ICD-10-CM | POA: Diagnosis present

## 2024-01-18 DIAGNOSIS — E872 Acidosis, unspecified: Secondary | ICD-10-CM | POA: Diagnosis present

## 2024-01-18 DIAGNOSIS — Z66 Do not resuscitate: Secondary | ICD-10-CM | POA: Diagnosis present

## 2024-01-18 DIAGNOSIS — L03113 Cellulitis of right upper limb: Secondary | ICD-10-CM | POA: Diagnosis present

## 2024-01-18 DIAGNOSIS — Z1152 Encounter for screening for COVID-19: Secondary | ICD-10-CM | POA: Diagnosis not present

## 2024-01-18 DIAGNOSIS — I10 Essential (primary) hypertension: Secondary | ICD-10-CM | POA: Diagnosis present

## 2024-01-18 LAB — CBC
HCT: 40.1 % (ref 36.0–46.0)
Hemoglobin: 13.8 g/dL (ref 12.0–15.0)
MCH: 28.8 pg (ref 26.0–34.0)
MCHC: 34.4 g/dL (ref 30.0–36.0)
MCV: 83.5 fL (ref 80.0–100.0)
Platelets: 307 10*3/uL (ref 150–400)
RBC: 4.8 MIL/uL (ref 3.87–5.11)
RDW: 14.6 % (ref 11.5–15.5)
WBC: 7.7 10*3/uL (ref 4.0–10.5)
nRBC: 0 % (ref 0.0–0.2)

## 2024-01-18 LAB — BASIC METABOLIC PANEL WITH GFR
Anion gap: 8 (ref 5–15)
BUN: 12 mg/dL (ref 8–23)
CO2: 25 mmol/L (ref 22–32)
Calcium: 8.2 mg/dL — ABNORMAL LOW (ref 8.9–10.3)
Chloride: 105 mmol/L (ref 98–111)
Creatinine, Ser: 0.91 mg/dL (ref 0.44–1.00)
GFR, Estimated: >60 mL/min (ref >60)
Glucose, Bld: 110 mg/dL — ABNORMAL HIGH (ref 70–99)
Potassium: 2.8 mmol/L — ABNORMAL LOW (ref 3.5–5.1)
Sodium: 138 mmol/L (ref 135–145)

## 2024-01-18 LAB — LACTIC ACID, PLASMA: Lactic Acid, Venous: 1 mmol/L (ref 0.5–1.9)

## 2024-01-18 MED ORDER — POTASSIUM CHLORIDE CRYS ER 20 MEQ PO TBCR
40.0000 meq | EXTENDED_RELEASE_TABLET | Freq: Two times a day (BID) | ORAL | Status: DC
  Administered 2024-01-18 – 2024-01-19 (×3): 40 meq via ORAL
  Filled 2024-01-18 (×3): qty 2

## 2024-01-18 NOTE — Progress Notes (Signed)
 PROGRESS NOTE    Donna Kirk  ZOX:[id number] DOB: 19-Aug-1946 DOA: 01/17/2024 PCP: Candiss Chamorro, MD    Brief Narrative:  78 year old with history of hypertension, hyperlipidemia, hypothyroidism, polycythemia vera, history of breast cancer who has been suffering from right upper extremity erythema and swelling for 3 to 4 weeks, worse for the last few days.  Seen by PCP last week and started on oral antibiotics, initially seen some improvement then erythematous again.  Denies any injury or insect bites.  She was at vascular lab for echocardiogram where she was found tachycardic and they asked her to go to ER. In the emergency room on room air.  Blood pressure stable.  Heart rate 131.  Temperature 98.1.  WBC count 15.8.  Lactic acid 2.5-3.  Admitted due to cellulitis of the right arm and failed outpatient therapy.  Subjective: Patient seen and examined.  She has mild to moderate pain in that area.  Swelling is persistent but she thinks the redness is less intense today.  No other overnight events.  Remains afebrile. Assessment & Plan:   Right arm cellulitis, sepsis present on admission: Spontaneous onset.  Failed outpatient therapy.  There is no evidence of necrotizing fasciitis or deep tissue infection.  DVT study is negative for thromboembolism.  Presented with leukocytosis, tachycardia and lactic acidosis meeting sepsis criteria. Currently stabilizing. Continue IV Ancef  today.  Blood cultures negative so far. Full range of motion.  Hypokalemia: Significant hypokalemia.  Hold chlorthalidone .  Replace aggressively.  Recheck levels.  Magnesium  is adequate.  Hypertension: Well-controlled.  Hold chlorthalidone  until potassium is better. Hyperlipidemia: On statin.  Continue. Hypothyroidism: On Synthroid .  Continue. Polycythemia vera: Currently stable.  On aspirin. 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  Currently not active issue.     DVT prophylaxis:  enoxaparin (LOVENOX) injection 40 mg Start: 01/17/24 2200   Code Status: DNR with limited intervention Family Communication: None at the bedside Disposition Plan: Status is: Observation The patient will require care spanning > 2 midnights and should be moved to inpatient because: IV antibiotics, electrolyte monitoring     Consultants:  None  Procedures:  None  Antimicrobials:  Ancef  5/9---     Objective: Vitals:   01/17/24 2100 01/18/24 0511 01/18/24 0810 01/18/24 0852  BP: (!) 148/72 139/74 128/62 (!) 113/57  Pulse: 90 89 80 82  Resp:  18  16  Temp:  98.5 F (36.9 C)  98 F (36.7 C)  TempSrc:  Oral  Oral  SpO2: 99% 93% 95% 95%  Weight:      Height:        Intake/Output Summary (Last 24 hours) at 01/18/2024 1046 Last data filed at 01/17/2024 1703 Gross per 24 hour  Intake 3107.38 ml  Output --  Net 3107.38 ml   Filed Weights   01/17/24 2022  Weight: 81.7 kg    Examination:  General exam: Appears calm and comfortable  Respiratory system: Clear to auscultation. Respiratory effort normal.  No added sounds. Cardiovascular system: S1 & S2 heard, RRR.  Gastrointestinal system: Abdomen is nondistended, soft and nontender. No organomegaly or masses felt. Normal bowel sounds heard. Central nervous system: Alert and oriented. No focal neurological deficits. Extremities: Symmetric 5 x 5 power. Skin:  Patient has erythematous lesion with fading margins right arm and forearm as in the pictures, blanchable, distal neurovascular status intact.  No palpable induration or abscess.  No open wounds.  No rashes in other parts of the body.    Data Reviewed: I have  personally reviewed following labs and imaging studies  CBC: Recent Labs  Lab 01/17/24 1328 01/18/24 0337  WBC 15.8* 7.7  NEUTROABS 14.0*  --   HGB 16.5* 13.8  HCT 48.7* 40.1  MCV 84.7 83.5  PLT 370 307   Basic Metabolic Panel: Recent Labs  Lab 01/17/24 1328 01/18/24 0337  NA 142 138  K 3.5 2.8*   CL 99 105  CO2 29 25  GLUCOSE 141* 110*  BUN 21 12  CREATININE 1.01* 0.91  CALCIUM 9.7 8.2*   GFR: Estimated Creatinine Clearance: 51.6 mL/min (by C-G formula based on SCr of 0.91 mg/dL). Liver Function Tests: Recent Labs  Lab 01/17/24 1328  AST 30  ALT 22  ALKPHOS 84  BILITOT 2.2*  PROT 7.1  ALBUMIN 4.3   No results for input(s): "LIPASE", "AMYLASE" in the last 168 hours. No results for input(s): "AMMONIA" in the last 168 hours. Coagulation Profile: Recent Labs  Lab 01/17/24 1416  INR 1.0   Cardiac Enzymes: No results for input(s): "CKTOTAL", "CKMB", "CKMBINDEX", "TROPONINI" in the last 168 hours. BNP (last 3 results) No results for input(s): "PROBNP" in the last 8760 hours. HbA1C: No results for input(s): "HGBA1C" in the last 72 hours. CBG: No results for input(s): "GLUCAP" in the last 168 hours. Lipid Profile: No results for input(s): "CHOL", "HDL", "LDLCALC", "TRIG", "CHOLHDL", "LDLDIRECT" in the last 72 hours. Thyroid Function Tests: No results for input(s): "TSH", "T4TOTAL", "FREET4", "T3FREE", "THYROIDAB" in the last 72 hours. Anemia Panel: No results for input(s): "VITAMINB12", "FOLATE", "FERRITIN", "TIBC", "IRON", "RETICCTPCT" in the last 72 hours. Sepsis Labs: Recent Labs  Lab 01/17/24 1258 01/17/24 1451 01/17/24 1655 01/18/24 0337  LATICACIDVEN 2.5* 2.7* 3.0* 1.0    Recent Results (from the past 240 hours)  Blood Culture (routine x 2)     Status: None (Preliminary result)   Collection Time: 01/17/24  2:19 PM   Specimen: BLOOD  Result Value Ref Range Status   Specimen Description BLOOD SITE NOT SPECIFIED  Final   Special Requests   Final    BOTTLES DRAWN AEROBIC ONLY Blood Culture results may not be optimal due to an inadequate volume of blood received in culture bottles   Culture   Final    NO GROWTH < 24 HOURS Performed at [facility], [address], [place], [place] [address]    Report Status PENDING  Incomplete  Blood Culture  (routine x 2)     Status: None (Preliminary result)   Collection Time: 01/17/24  2:20 PM   Specimen: BLOOD  Result Value Ref Range Status   Specimen Description BLOOD RIGHT ANTECUBITAL  Final   Special Requests   Final    BOTTLES DRAWN AEROBIC AND ANAEROBIC Blood Culture results may not be optimal due to an inadequate volume of blood received in culture bottles   Culture   Final    NO GROWTH < 24 HOURS Performed at [facility], [address], [place], [place] [address]    Report Status PENDING  Incomplete  Resp panel by RT-PCR (RSV, Flu A&B, Covid)     Status: None   Collection Time: 01/17/24  2:46 PM   Specimen: Nasal Swab  Result Value Ref Range Status   SARS Coronavirus 2 by RT PCR NEGATIVE NEGATIVE Final   Influenza A by PCR NEGATIVE NEGATIVE Final   Influenza B by PCR NEGATIVE NEGATIVE Final    Comment: (NOTE) The Xpert Xpress SARS-CoV-2/FLU/RSV plus assay is intended as an aid in  the diagnosis of influenza from Nasopharyngeal swab specimens and should not be used as a sole basis for treatment. Nasal washings and aspirates are unacceptable for Xpert Xpress SARS-CoV-2/FLU/RSV testing.  Fact Sheet for Patients: BloggerCourse.com  Fact Sheet for Healthcare Providers: SeriousBroker.it  This test is not yet approved or cleared by the [place]  FDA and has been authorized for detection and/or diagnosis of SARS-CoV-2 by FDA under an Emergency Use Authorization (EUA). This EUA will remain in effect (meaning this test can be used) for the duration of the COVID-19 declaration under Section 564(b)(1) of the Act, 21 U.S.C. section 360bbb-3(b)(1), unless the authorization is terminated or revoked.     Resp Syncytial Virus by PCR NEGATIVE NEGATIVE Final    Comment: (NOTE) Fact Sheet for Patients: BloggerCourse.com  Fact Sheet for Healthcare  Providers: SeriousBroker.it  This test is not yet approved or cleared by the [place]  FDA and has been authorized for detection and/or diagnosis of SARS-CoV-2 by FDA under an Emergency Use Authorization (EUA). This EUA will remain in effect (meaning this test can be used) for the duration of the COVID-19 declaration under Section 564(b)(1) of the Act, 21 U.S.C. section 360bbb-3(b)(1), unless the authorization is terminated or revoked.  Performed at [facility], [address] [place], [place] [address]          Radiology Studies: UE Venous Duplex ([facility] and [facility] ONLY) Result Date: 01/18/2024 UPPER VENOUS STUDY  Patient Name:  Donna Kirk  Date of Exam:   01/17/2024 Medical Rec #: [id number]          Accession #:    [id number] Date of Birth: 27-Feb-1946          Patient Gender: F Patient Age:   13 years Exam Location:  [facility] Procedure:      VAS US  UPPER EXTREMITY VENOUS DUPLEX Referring Phys: COLLIN BAUER --------------------------------------------------------------------------------  Indications: Pain, Swelling, and Erythema Other Indications: Cellulitis. Comparison Study: No prior studies on file Performing Technologist: Carleene Chase RVS  Examination Guidelines: A complete evaluation includes B-mode imaging, spectral Doppler, color Doppler, and power Doppler as needed of all accessible portions of each vessel. Bilateral testing is considered an integral part of a complete examination. Limited examinations for reoccurring indications may be performed as noted.  Right Findings: +----------+------------+---------+-----------+----------+-------+ RIGHT     CompressiblePhasicitySpontaneousPropertiesSummary +----------+------------+---------+-----------+----------+-------+ IJV           Full       Yes       Yes                      +----------+------------+---------+-----------+----------+-------+ Subclavian               Yes        Yes                      +----------+------------+---------+-----------+----------+-------+ Axillary                 Yes       Yes                      +----------+------------+---------+-----------+----------+-------+ Brachial      Full                                          +----------+------------+---------+-----------+----------+-------+ Radial  Full                                          +----------+------------+---------+-----------+----------+-------+ Ulnar         Full                                          +----------+------------+---------+-----------+----------+-------+ Cephalic      Full                                          +----------+------------+---------+-----------+----------+-------+ Basilic       Full                                          +----------+------------+---------+-----------+----------+-------+  Left Findings: +----------+------------+---------+-----------+----------+-------+ LEFT      CompressiblePhasicitySpontaneousPropertiesSummary +----------+------------+---------+-----------+----------+-------+ Subclavian    Full       Yes       Yes                      +----------+------------+---------+-----------+----------+-------+  Summary:  Right: No evidence of deep vein thrombosis in the upper extremity. No evidence of superficial vein thrombosis in the upper extremity.  Left: No evidence of thrombosis in the subclavian.  *See table(s) above for measurements and observations.  Diagnosing physician: Angela Kell MD Electronically signed by Angela Kell MD on 01/18/2024 at 9:53:30 AM.    Final    DG Chest 2 View Result Date: 01/17/2024 CLINICAL DATA:  Tachycardia EXAM: CHEST - 2 VIEW COMPARISON:  Chest radiograph dated 08/03/2021 FINDINGS: Normal lung volumes. No focal consolidations. No pleural effusion or pneumothorax. The heart size and mediastinal contours are within normal limits. No acute osseous abnormality.  Right axillary surgical clips. IMPRESSION: No active cardiopulmonary disease. Electronically Signed   By: Limin  Xu M.D.   On: 01/17/2024 14:14   ECHOCARDIOGRAM COMPLETE Result Date: 01/17/2024    ECHOCARDIOGRAM REPORT   Patient Name:   Donna Kirk Date of Exam: 01/17/2024 Medical Rec #:  [id number]         Height:       63.0 in Accession #:    [id number]        Weight:       173.8 lb Date of Birth:  May 13, 1946         BSA:          1.822 m Patient Age:    78 years          BP:           136/82 mmHg Patient Gender: F                 HR:           107 bpm. Exam Location:  Outpatient Procedure: 2D Echo, Cardiac Doppler and Color Doppler (Both Spectral and Color            Flow Doppler were utilized during procedure). Indications:    Asymmetric septal hypertrophy [I51.7 (ICD-10-CM)]; Murmur,                 cardiac [R01.1 (  ICD-10-CM)]  History:        Patient has prior history of Echocardiogram examinations, most                 recent 04/15/2019. Hypertrophic Cardiomyopathy, Arrythmias:RBBB;                 Risk Factors:Hypertension and Non-Smoker.  Sonographer:    Delford Felling MHA, RDMS, RVT, RDCS Referring Phys: [id number] [facility]  Sonographer Comments: Restricted mobility, tachycardia. IMPRESSIONS  1. Left ventricular ejection fraction, by estimation, is 60 to 65%. The left ventricle has normal function. The left ventricle has no regional wall motion abnormalities. There is mild concentric left ventricular hypertrophy. Left ventricular diastolic parameters are consistent with Grade I diastolic dysfunction (impaired relaxation).  2. Right ventricular systolic function is normal. The right ventricular size is normal. There is normal pulmonary artery systolic pressure.  3. The mitral valve is normal in structure. No evidence of mitral valve regurgitation. No evidence of mitral stenosis.  4. The aortic valve is tricuspid. There is mild calcification of the aortic valve. There is mild thickening of the aortic  valve. Aortic valve regurgitation is not visualized. Aortic valve sclerosis/calcification is present, without any evidence of aortic stenosis. Aortic valve area, by VTI measures 1.17 cm. Aortic valve mean gradient measures 8.0 mmHg. Aortic valve Vmax measures 1.99 m/s.  5. The inferior vena cava is normal in size with greater than 50% respiratory variability, suggesting right atrial pressure of 3 mmHg. FINDINGS  Left Ventricle: Left ventricular ejection fraction, by estimation, is 60 to 65%. The left ventricle has normal function. The left ventricle has no regional wall motion abnormalities. The left ventricular internal cavity size was normal in size. There is  mild concentric left ventricular hypertrophy. Left ventricular diastolic parameters are consistent with Grade I diastolic dysfunction (impaired relaxation). Right Ventricle: The right ventricular size is normal. No increase in right ventricular wall thickness. Right ventricular systolic function is normal. There is normal pulmonary artery systolic pressure. The tricuspid regurgitant velocity is 1.78 m/s, and  with an assumed right atrial pressure of 3 mmHg, the estimated right ventricular systolic pressure is 15.7 mmHg. Left Atrium: Left atrial size was normal in size. Right Atrium: Right atrial size was normal in size. Pericardium: There is no evidence of pericardial effusion. Mitral Valve: The mitral valve is normal in structure. No evidence of mitral valve regurgitation. No evidence of mitral valve stenosis. Tricuspid Valve: The tricuspid valve is normal in structure. Tricuspid valve regurgitation is trivial. No evidence of tricuspid stenosis. Aortic Valve: The aortic valve is tricuspid. There is mild calcification of the aortic valve. There is mild thickening of the aortic valve. Aortic valve regurgitation is not visualized. Aortic valve sclerosis/calcification is present, without any evidence of aortic stenosis. Aortic valve mean gradient measures 8.0  mmHg. Aortic valve peak gradient measures 15.8 mmHg. Aortic valve area, by VTI measures 1.17 cm. Pulmonic Valve: The pulmonic valve was normal in structure. Pulmonic valve regurgitation is not visualized. No evidence of pulmonic stenosis. Aorta: The aortic root is normal in size and structure. Venous: The inferior vena cava is normal in size with greater than 50% respiratory variability, suggesting right atrial pressure of 3 mmHg. IAS/Shunts: No atrial level shunt detected by color flow Doppler.  LEFT VENTRICLE PLAX 2D LVIDd:         3.35 cm LVIDs:         1.85 cm LV PW:         1.10 cm  LV IVS:        1.10 cm LVOT diam:     1.63 cm LV SV:         32 LV SV Index:   18 LVOT Area:     2.09 cm  RIGHT VENTRICLE          IVC RV Basal diam:  2.80 cm  IVC diam: 2.16 cm RV Mid diam:    2.59 cm TAPSE (M-mode): 2.4 cm LEFT ATRIUM           Index        RIGHT ATRIUM           Index LA diam:      3.21 cm 1.76 cm/m   RA Area:     10.60 cm LA Vol (A4C): 21.7 ml 11.91 ml/m  RA Volume:   18.90 ml  10.38 ml/m  AORTIC VALVE AV Area (Vmax):    1.05 cm AV Area (Vmean):   1.09 cm AV Area (VTI):     1.17 cm AV Vmax:           198.67 cm/s AV Vmean:          129.000 cm/s AV VTI:            0.274 m AV Peak Grad:      15.8 mmHg AV Mean Grad:      8.0 mmHg LVOT Vmax:         99.90 cm/s LVOT Vmean:        67.600 cm/s LVOT VTI:          0.154 m LVOT/AV VTI ratio: 0.56  AORTA Ao Root diam: 2.85 cm Ao Asc diam:  3.45 cm MITRAL VALVE                TRICUSPID VALVE MV Area (PHT): 4.89 cm     TR Peak grad:   12.7 mmHg MV Decel Time: 155 msec     TR Vmax:        178.00 cm/s MR Peak grad: 3.0 mmHg MR Vmax:      86.00 cm/s    SHUNTS MV E velocity: 85.40 cm/s   Systemic VTI:  0.15 m MV A velocity: 140.00 cm/s  Systemic Diam: 1.63 cm MV E/A ratio:  0.61 Maudine Sos MD Electronically signed by Maudine Sos MD Signature Date/Time: 01/17/2024/1:34:10 PM    Final         Scheduled Meds:  aspirin EC  81 mg Oral Daily   enoxaparin  (LOVENOX) injection  40 mg Subcutaneous Q24H   levothyroxine   100 mcg Oral QAC breakfast   potassium chloride  SA  40 mEq Oral BID   simvastatin   20 mg Oral Daily   Continuous Infusions:   ceFAZolin  (ANCEF ) IV 2 g (01/18/24 0611)     LOS: 0 days      Vada Garibaldi, MD [facility]

## 2024-01-18 NOTE — Plan of Care (Signed)

## 2024-01-18 NOTE — Plan of Care (Signed)
  Problem: Education: Goal: Knowledge of General Education information will improve Description: Including pain rating scale, medication(s)/side effects and non-pharmacologic comfort measures Outcome: Progressing   Problem: Health Behavior/Discharge Planning: Goal: Ability to manage health-related needs will improve Outcome: Progressing   Problem: Clinical Measurements: Goal: Ability to maintain clinical measurements within normal limits will improve Outcome: Progressing Goal: Will remain free from infection Outcome: Progressing Goal: Diagnostic test results will improve Outcome: Progressing   Problem: Activity: Goal: Risk for activity intolerance will decrease Outcome: Progressing   Problem: Nutrition: Goal: Adequate nutrition will be maintained Outcome: Progressing   Problem: Coping: Goal: Level of anxiety will decrease Outcome: Progressing   Problem: Pain Managment: Goal: General experience of comfort will improve and/or be controlled Outcome: Progressing

## 2024-01-18 NOTE — Care Management Obs Status (Signed)
 MEDICARE OBSERVATION STATUS NOTIFICATION   Patient Details  Name: Donna KLAUSEN MRN: [id number] Date of Birth: Jun 01, 1946   Medicare Observation Status Notification Given:  Yes    Gladiola Madore G., RN 01/18/2024, 9:18 AM

## 2024-01-19 DIAGNOSIS — L03113 Cellulitis of right upper limb: Secondary | ICD-10-CM | POA: Diagnosis not present

## 2024-01-19 LAB — CBC WITH DIFFERENTIAL/PLATELET
Abs Immature Granulocytes: 0.03 10*3/uL (ref 0.00–0.07)
Basophils Absolute: 0 10*3/uL (ref 0.0–0.1)
Basophils Relative: 1 %
Eosinophils Absolute: 0.2 10*3/uL (ref 0.0–0.5)
Eosinophils Relative: 3 %
HCT: 42.5 % (ref 36.0–46.0)
Hemoglobin: 14.4 g/dL (ref 12.0–15.0)
Immature Granulocytes: 1 %
Lymphocytes Relative: 18 %
Lymphs Abs: 1.2 10*3/uL (ref 0.7–4.0)
MCH: 28.5 pg (ref 26.0–34.0)
MCHC: 33.9 g/dL (ref 30.0–36.0)
MCV: 84.2 fL (ref 80.0–100.0)
Monocytes Absolute: 0.9 10*3/uL (ref 0.1–1.0)
Monocytes Relative: 13 %
Neutro Abs: 4.3 10*3/uL (ref 1.7–7.7)
Neutrophils Relative %: 64 %
Platelets: 317 10*3/uL (ref 150–400)
RBC: 5.05 MIL/uL (ref 3.87–5.11)
RDW: 14.8 % (ref 11.5–15.5)
WBC: 6.6 10*3/uL (ref 4.0–10.5)
nRBC: 0 % (ref 0.0–0.2)

## 2024-01-19 LAB — BASIC METABOLIC PANEL WITH GFR
Anion gap: 10 (ref 5–15)
BUN: 12 mg/dL (ref 8–23)
CO2: 25 mmol/L (ref 22–32)
Calcium: 9 mg/dL (ref 8.9–10.3)
Chloride: 106 mmol/L (ref 98–111)
Creatinine, Ser: 0.9 mg/dL (ref 0.44–1.00)
GFR, Estimated: >60 mL/min (ref >60)
Glucose, Bld: 104 mg/dL — ABNORMAL HIGH (ref 70–99)
Potassium: 3.6 mmol/L (ref 3.5–5.1)
Sodium: 141 mmol/L (ref 135–145)

## 2024-01-19 MED ORDER — CEPHALEXIN 500 MG PO CAPS: 500.0000 mg | ORAL_CAPSULE | Freq: Three times a day (TID) | ORAL | 0 refills | Status: AC

## 2024-01-19 MED ORDER — DOXYCYCLINE HYCLATE 100 MG PO TABS: 100.0000 mg | ORAL_TABLET | Freq: Two times a day (BID) | ORAL | 0 refills | Status: AC

## 2024-01-19 MED ORDER — POTASSIUM CHLORIDE ER 20 MEQ PO TBCR: 20.0000 meq | EXTENDED_RELEASE_TABLET | Freq: Two times a day (BID) | ORAL | 0 refills | Status: AC

## 2024-01-19 NOTE — Progress Notes (Signed)
 DISCHARGE NOTE HOME [facility] DEWING to be discharged Home per MD order. Discussed prescriptions and follow up appointments with the patient. Prescriptions given to patient; medication list explained in detail. Patient verbalized understanding.  Skin clean, dry and intact without evidence of skin break down, no evidence of skin tears noted. IV catheter discontinued intact. Site without signs and symptoms of complications. Dressing and pressure applied. Pt denies pain at the site currently. No complaints noted.  Patient free of lines, drains, and wounds.   An After Visit Summary (AVS) was printed and given to the patient. Patient escorted via wheelchair, and discharged home via private auto.  Tonda Francisco, RN

## 2024-01-19 NOTE — Discharge Summary (Signed)
 Physician Discharge Summary  Donna Kirk GNF:[id number] DOB: 1946-09-04 DOA: 01/17/2024  PCP: Candiss Chamorro, MD  Admit date: 01/17/2024 Discharge date: 01/19/2024  Admitted From: Home Disposition: Home  Recommendations for Outpatient Follow-up:  Follow up with PCP in 1-2 weeks Please obtain BMP/CBC in one week Please follow up on the following pending results: Blood cultures drawn 5/9  Home Health: N/A Equipment/Devices: N/A  Discharge Condition: Stable CODE STATUS: Full code Diet recommendation: Regular diet  Discharge summary: 78 year old with history of hypertension, hyperlipidemia, hypothyroidism, polycythemia vera, history of breast cancer who has been suffering from right upper extremity erythema and swelling for 3 to 4 weeks, worse for the last few days.  Seen by PCP last week and started on oral antibiotics, initially seen some improvement then erythematous again.  Denies any injury or insect bites.  She was at vascular lab for echocardiogram where she was found tachycardic and they asked her to go to ER. In the emergency room on room air.  Blood pressure stable.  Heart rate 131.  Temperature 98.1.  WBC count 15.8.  Lactic acid 2.5-3.  Admitted due to cellulitis of the right arm and failed outpatient therapy.   Right arm cellulitis, sepsis present on admission: Spontaneous onset.  Failed outpatient therapy.  There is no evidence of necrotizing fasciitis or deep tissue infection.  DVT study is negative for thromboembolism.  Presented with leukocytosis, tachycardia and lactic acidosis meeting sepsis criteria. Patient currently improved.  Rash is improving.  Blood cultures negative so far.  Treated with IV Ancef  for last 48 hours. Given clinical stability and no evidence of complication, will treat patient with broad-spectrum antibiotics.  Will prescribe doxycycline and Keflex for 1 week.  Follow-up outpatient.   Hypokalemia: Significant hypokalemia.  Replaced and adequate  today.  She will continue chlorthalidone , increasing dose of long-term potassium chloride  20 mEq twice daily.  Magnesium  is adequate.  She will benefit with recheck in 1 to 2 weeks.   Hypertension: Well-controlled.  Resume chlorthalidone  along with potassium supplementation.  Hyperlipidemia: On statin.  Continue.  Hypothyroidism: On Synthroid .  Continue.  Polycythemia vera: Currently stable.  On aspirin.  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  Currently not active issue.  Medical stabilized and improving to go home today.  Discharge Diagnoses:  Principal Problem:   Cellulitis of right upper extremity Active Problems:   HTN (hypertension)   Polycythemia vera (HCC)   Hypothyroidism   Hyperlipidemia   Cellulitis and abscess of hand    Discharge Instructions  Discharge Instructions     Diet - low sodium heart healthy   Complete by: As directed    Increase activity slowly   Complete by: As directed       Allergies as of 01/19/2024   No Known Allergies      Medication List     TAKE these medications    aspirin EC 81 MG tablet Take 81 mg by mouth daily. Swallow whole.   CALCIUM PO Take 1 tablet by mouth daily.   cephALEXin 500 MG capsule Commonly known as: KEFLEX Take 1 capsule (500 mg total) by mouth 3 (three) times daily for 7 days.   chlorthalidone  25 MG tablet Commonly known as: HYGROTON  Take 1 tablet (25 mg total) by mouth daily.   doxycycline 100 MG tablet Commonly known as: VIBRA-TABS Take 1 tablet (100 mg total) by mouth 2 (two) times daily for 7 days.   levothyroxine  100 MCG tablet Commonly known as: SYNTHROID  Take 100 mcg by  mouth daily before breakfast.   Potassium Chloride  ER 20 MEQ Tbcr Take 1 tablet (20 mEq total) by mouth 2 (two) times daily. What changed: when to take this   simvastatin  20 MG tablet Commonly known as: ZOCOR  Take 20 mg by mouth daily.   VITAMIN D -3 PO Take 1 capsule by mouth daily.         No Known Allergies  Consultations: None   Procedures/Studies: UE Venous Duplex ([facility] and [facility] ONLY) Result Date: 01/18/2024 UPPER VENOUS STUDY  Patient Name:  Donna Kirk  Date of Exam:   01/17/2024 Medical Rec #: [id number]          Accession #:    [id number] Date of Birth: 1946-04-18          Patient Gender: F Patient Age:   77 years Exam Location:  [facility] Procedure:      VAS US  UPPER EXTREMITY VENOUS DUPLEX Referring Phys: COLLIN BAUER --------------------------------------------------------------------------------  Indications: Pain, Swelling, and Erythema Other Indications: Cellulitis. Comparison Study: No prior studies on file Performing Technologist: Carleene Chase RVS  Examination Guidelines: A complete evaluation includes B-mode imaging, spectral Doppler, color Doppler, and power Doppler as needed of all accessible portions of each vessel. Bilateral testing is considered an integral part of a complete examination. Limited examinations for reoccurring indications may be performed as noted.  Right Findings: +----------+------------+---------+-----------+----------+-------+ RIGHT     CompressiblePhasicitySpontaneousPropertiesSummary +----------+------------+---------+-----------+----------+-------+ IJV           Full       Yes       Yes                      +----------+------------+---------+-----------+----------+-------+ Subclavian               Yes       Yes                      +----------+------------+---------+-----------+----------+-------+ Axillary                 Yes       Yes                      +----------+------------+---------+-----------+----------+-------+ Brachial      Full                                          +----------+------------+---------+-----------+----------+-------+ Radial        Full                                          +----------+------------+---------+-----------+----------+-------+ Ulnar         Full                                           +----------+------------+---------+-----------+----------+-------+ Cephalic      Full                                          +----------+------------+---------+-----------+----------+-------+ Basilic       Full                                          +----------+------------+---------+-----------+----------+-------+  Left Findings: +----------+------------+---------+-----------+----------+-------+ LEFT      CompressiblePhasicitySpontaneousPropertiesSummary +----------+------------+---------+-----------+----------+-------+ Subclavian    Full       Yes       Yes                      +----------+------------+---------+-----------+----------+-------+  Summary:  Right: No evidence of deep vein thrombosis in the upper extremity. No evidence of superficial vein thrombosis in the upper extremity.  Left: No evidence of thrombosis in the subclavian.  *See table(s) above for measurements and observations.  Diagnosing physician: Angela Kell MD Electronically signed by Angela Kell MD on 01/18/2024 at 9:53:30 AM.    Final    DG Chest 2 View Result Date: 01/17/2024 CLINICAL DATA:  Tachycardia EXAM: CHEST - 2 VIEW COMPARISON:  Chest radiograph dated 08/03/2021 FINDINGS: Normal lung volumes. No focal consolidations. No pleural effusion or pneumothorax. The heart size and mediastinal contours are within normal limits. No acute osseous abnormality. Right axillary surgical clips. IMPRESSION: No active cardiopulmonary disease. Electronically Signed   By: Limin  Xu M.D.   On: 01/17/2024 14:14   ECHOCARDIOGRAM COMPLETE Result Date: 01/17/2024    ECHOCARDIOGRAM REPORT   Patient Name:   Donna Kirk Date of Exam: 01/17/2024 Medical Rec #:  [id number]         Height:       63.0 in Accession #:    [id number]        Weight:       173.8 lb Date of Birth:  1946/01/11         BSA:          1.822 m Patient Age:    78 years          BP:           136/82 mmHg Patient  Gender: F                 HR:           107 bpm. Exam Location:  Outpatient Procedure: 2D Echo, Cardiac Doppler and Color Doppler (Both Spectral and Color            Flow Doppler were utilized during procedure). Indications:    Asymmetric septal hypertrophy [I51.7 (ICD-10-CM)]; Murmur,                 cardiac [R01.1 (ICD-10-CM)]  History:        Patient has prior history of Echocardiogram examinations, most                 recent 04/15/2019. Hypertrophic Cardiomyopathy, Arrythmias:RBBB;                 Risk Factors:Hypertension and Non-Smoker.  Sonographer:    Delford Felling MHA, RDMS, RVT, RDCS Referring Phys: [id number]  Sonographer Comments: Restricted mobility, tachycardia. IMPRESSIONS  1. Left ventricular ejection fraction, by estimation, is 60 to 65%. The left ventricle has normal function. The left ventricle has no regional wall motion abnormalities. There is mild concentric left ventricular hypertrophy. Left ventricular diastolic parameters are consistent with Grade I diastolic dysfunction (impaired relaxation).  2. Right ventricular systolic function is normal. The right ventricular size is normal. There is normal pulmonary artery systolic pressure.  3. The mitral valve is normal in structure. No evidence of mitral valve regurgitation. No evidence of mitral stenosis.  4. The aortic valve is tricuspid. There is mild calcification of the aortic valve. There is mild thickening of the aortic valve. Aortic valve  regurgitation is not visualized. Aortic valve sclerosis/calcification is present, without any evidence of aortic stenosis. Aortic valve area, by VTI measures 1.17 cm. Aortic valve mean gradient measures 8.0 mmHg. Aortic valve Vmax measures 1.99 m/s.  5. The inferior vena cava is normal in size with greater than 50% respiratory variability, suggesting right atrial pressure of 3 mmHg. FINDINGS  Left Ventricle: Left ventricular ejection fraction, by estimation, is 60 to 65%. The left ventricle  has normal function. The left ventricle has no regional wall motion abnormalities. The left ventricular internal cavity size was normal in size. There is  mild concentric left ventricular hypertrophy. Left ventricular diastolic parameters are consistent with Grade I diastolic dysfunction (impaired relaxation). Right Ventricle: The right ventricular size is normal. No increase in right ventricular wall thickness. Right ventricular systolic function is normal. There is normal pulmonary artery systolic pressure. The tricuspid regurgitant velocity is 1.78 m/s, and  with an assumed right atrial pressure of 3 mmHg, the estimated right ventricular systolic pressure is 15.7 mmHg. Left Atrium: Left atrial size was normal in size. Right Atrium: Right atrial size was normal in size. Pericardium: There is no evidence of pericardial effusion. Mitral Valve: The mitral valve is normal in structure. No evidence of mitral valve regurgitation. No evidence of mitral valve stenosis. Tricuspid Valve: The tricuspid valve is normal in structure. Tricuspid valve regurgitation is trivial. No evidence of tricuspid stenosis. Aortic Valve: The aortic valve is tricuspid. There is mild calcification of the aortic valve. There is mild thickening of the aortic valve. Aortic valve regurgitation is not visualized. Aortic valve sclerosis/calcification is present, without any evidence of aortic stenosis. Aortic valve mean gradient measures 8.0 mmHg. Aortic valve peak gradient measures 15.8 mmHg. Aortic valve area, by VTI measures 1.17 cm. Pulmonic Valve: The pulmonic valve was normal in structure. Pulmonic valve regurgitation is not visualized. No evidence of pulmonic stenosis. Aorta: The aortic root is normal in size and structure. Venous: The inferior vena cava is normal in size with greater than 50% respiratory variability, suggesting right atrial pressure of 3 mmHg. IAS/Shunts: No atrial level shunt detected by color flow Doppler.  LEFT VENTRICLE  PLAX 2D LVIDd:         3.35 cm LVIDs:         1.85 cm LV PW:         1.10 cm LV IVS:        1.10 cm LVOT diam:     1.63 cm LV SV:         32 LV SV Index:   18 LVOT Area:     2.09 cm  RIGHT VENTRICLE          IVC RV Basal diam:  2.80 cm  IVC diam: 2.16 cm RV Mid diam:    2.59 cm TAPSE (M-mode): 2.4 cm LEFT ATRIUM           Index        RIGHT ATRIUM           Index LA diam:      3.21 cm 1.76 cm/m   RA Area:     10.60 cm LA Vol (A4C): 21.7 ml 11.91 ml/m  RA Volume:   18.90 ml  10.38 ml/m  AORTIC VALVE AV Area (Vmax):    1.05 cm AV Area (Vmean):   1.09 cm AV Area (VTI):     1.17 cm AV Vmax:           198.67 cm/s AV Vmean:  129.000 cm/s AV VTI:            0.274 m AV Peak Grad:      15.8 mmHg AV Mean Grad:      8.0 mmHg LVOT Vmax:         99.90 cm/s LVOT Vmean:        67.600 cm/s LVOT VTI:          0.154 m LVOT/AV VTI ratio: 0.56  AORTA Ao Root diam: 2.85 cm Ao Asc diam:  3.45 cm MITRAL VALVE                TRICUSPID VALVE MV Area (PHT): 4.89 cm     TR Peak grad:   12.7 mmHg MV Decel Time: 155 msec     TR Vmax:        178.00 cm/s MR Peak grad: 3.0 mmHg MR Vmax:      86.00 cm/s    SHUNTS MV E velocity: 85.40 cm/s   Systemic VTI:  0.15 m MV A velocity: 140.00 cm/s  Systemic Diam: 1.63 cm MV E/A ratio:  0.61 Maudine Sos MD Electronically signed by Maudine Sos MD Signature Date/Time: 01/17/2024/1:34:10 PM    Final    (Echo, Carotid, EGD, Colonoscopy, ERCP)    Subjective: Patient seen and examined.  Denies any complaints.  Denies any pain.  Still has some skin changes but most of the erythema has improved.   Discharge Exam: Vitals:   01/19/24 0541 01/19/24 0811  BP: 123/65 (!) 159/130  Pulse: 86 77  Resp:    Temp: 98.8 F (37.1 C)   SpO2: 100% 95%   Vitals:   01/18/24 0852 01/18/24 1954 01/19/24 0541 01/19/24 0811  BP: (!) 113/57 (!) 105/47 123/65 (!) 159/130  Pulse: 82 83 86 77  Resp: 16     Temp: 98 F (36.7 C) 98.3 F (36.8 C) 98.8 F (37.1 C)   TempSrc: Oral Oral Oral    SpO2: 95% 98% 100% 95%  Weight:      Height:        General: Pt is alert, awake, not in acute distress Cardiovascular: RRR, S1/S2 +, no rubs, no gallops Respiratory: CTA bilaterally, no wheezing, no rhonchi Abdominal: Soft, NT, ND, bowel sounds + Extremities: no edema, no cyanosis Right arm with receding erythema, presence of full range of motion.     The results of significant diagnostics from this hospitalization (including imaging, microbiology, ancillary and laboratory) are listed below for reference.     Microbiology: Recent Results (from the past 240 hours)  Blood Culture (routine x 2)     Status: None (Preliminary result)   Collection Time: 01/17/24  2:19 PM   Specimen: BLOOD  Result Value Ref Range Status   Specimen Description BLOOD SITE NOT SPECIFIED  Final   Special Requests   Final    BOTTLES DRAWN AEROBIC ONLY Blood Culture results may not be optimal due to an inadequate volume of blood received in culture bottles   Culture   Final    NO GROWTH 2 DAYS Performed at [facility], [address] [place], [place] [address]    Report Status PENDING  Incomplete  Blood Culture (routine x 2)     Status: None (Preliminary result)   Collection Time: 01/17/24  2:20 PM   Specimen: BLOOD  Result Value Ref Range Status   Specimen Description BLOOD RIGHT ANTECUBITAL  Final   Special Requests   Final    BOTTLES DRAWN AEROBIC  AND ANAEROBIC Blood Culture results may not be optimal due to an inadequate volume of blood received in culture bottles   Culture   Final    NO GROWTH 2 DAYS Performed at [facility], [address] [place], [place] [address]    Report Status PENDING  Incomplete  Resp panel by RT-PCR (RSV, Flu A&B, Covid)     Status: None   Collection Time: 01/17/24  2:46 PM   Specimen: Nasal Swab  Result Value Ref Range Status   SARS Coronavirus 2 by RT PCR NEGATIVE NEGATIVE Final   Influenza A by PCR NEGATIVE NEGATIVE Final   Influenza B by PCR  NEGATIVE NEGATIVE Final    Comment: (NOTE) The Xpert Xpress SARS-CoV-2/FLU/RSV plus assay is intended as an aid in the diagnosis of influenza from Nasopharyngeal swab specimens and should not be used as a sole basis for treatment. Nasal washings and aspirates are unacceptable for Xpert Xpress SARS-CoV-2/FLU/RSV testing.  Fact Sheet for Patients: BloggerCourse.com  Fact Sheet for Healthcare Providers: SeriousBroker.it  This test is not yet approved or cleared by the [place]  FDA and has been authorized for detection and/or diagnosis of SARS-CoV-2 by FDA under an Emergency Use Authorization (EUA). This EUA will remain in effect (meaning this test can be used) for the duration of the COVID-19 declaration under Section 564(b)(1) of the Act, 21 U.S.C. section 360bbb-3(b)(1), unless the authorization is terminated or revoked.     Resp Syncytial Virus by PCR NEGATIVE NEGATIVE Final    Comment: (NOTE) Fact Sheet for Patients: BloggerCourse.com  Fact Sheet for Healthcare Providers: SeriousBroker.it  This test is not yet approved or cleared by the [place]  FDA and has been authorized for detection and/or diagnosis of SARS-CoV-2 by FDA under an Emergency Use Authorization (EUA). This EUA will remain in effect (meaning this test can be used) for the duration of the COVID-19 declaration under Section 564(b)(1) of the Act, 21 U.S.C. section 360bbb-3(b)(1), unless the authorization is terminated or revoked.  Performed at [facility], [address], [place], [place] [address]      Labs: BNP (last 3 results) No results for input(s): "BNP" in the last 8760 hours. Basic Metabolic Panel: Recent Labs  Lab 01/17/24 1328 01/18/24 0337 01/19/24 0744  NA 142 138 141  K 3.5 2.8* 3.6  CL 99 105 106  CO2 29 25 25   GLUCOSE 141* 110* 104*  BUN 21 12 12   CREATININE 1.01*  0.91 0.90  CALCIUM 9.7 8.2* 9.0   Liver Function Tests: Recent Labs  Lab 01/17/24 1328  AST 30  ALT 22  ALKPHOS 84  BILITOT 2.2*  PROT 7.1  ALBUMIN 4.3   No results for input(s): "LIPASE", "AMYLASE" in the last 168 hours. No results for input(s): "AMMONIA" in the last 168 hours. CBC: Recent Labs  Lab 01/17/24 1328 01/18/24 0337 01/19/24 0744  WBC 15.8* 7.7 6.6  NEUTROABS 14.0*  --  4.3  HGB 16.5* 13.8 14.4  HCT 48.7* 40.1 42.5  MCV 84.7 83.5 84.2  PLT 370 307 317   Cardiac Enzymes: No results for input(s): "CKTOTAL", "CKMB", "CKMBINDEX", "TROPONINI" in the last 168 hours. BNP: Invalid input(s): "POCBNP" CBG: No results for input(s): "GLUCAP" in the last 168 hours. D-Dimer No results for input(s): "DDIMER" in the last 72 hours. Hgb A1c No results for input(s): "HGBA1C" in the last 72 hours. Lipid Profile No results for input(s): "CHOL", "HDL", "LDLCALC", "TRIG", "CHOLHDL", "LDLDIRECT" in the last 72 hours. Thyroid function  studies No results for input(s): "TSH", "T4TOTAL", "T3FREE", "THYROIDAB" in the last 72 hours.  Invalid input(s): "FREET3" Anemia work up No results for input(s): "VITAMINB12", "FOLATE", "FERRITIN", "TIBC", "IRON", "RETICCTPCT" in the last 72 hours. Urinalysis    Component Value Date/Time   COLORURINE YELLOW 01/17/2024 1654   APPEARANCEUR CLEAR 01/17/2024 1654   LABSPEC 1.014 01/17/2024 1654   PHURINE 6.0 01/17/2024 1654   GLUCOSEU NEGATIVE 01/17/2024 1654   HGBUR NEGATIVE 01/17/2024 1654   BILIRUBINUR NEGATIVE 01/17/2024 1654   KETONESUR NEGATIVE 01/17/2024 1654   PROTEINUR NEGATIVE 01/17/2024 1654   NITRITE NEGATIVE 01/17/2024 1654   LEUKOCYTESUR NEGATIVE 01/17/2024 1654   Sepsis Labs Recent Labs  Lab 01/17/24 1328 01/18/24 0337 01/19/24 0744  WBC 15.8* 7.7 6.6   Microbiology Recent Results (from the past 240 hours)  Blood Culture (routine x 2)     Status: None (Preliminary result)   Collection Time: 01/17/24  2:19 PM    Specimen: BLOOD  Result Value Ref Range Status   Specimen Description BLOOD SITE NOT SPECIFIED  Final   Special Requests   Final    BOTTLES DRAWN AEROBIC ONLY Blood Culture results may not be optimal due to an inadequate volume of blood received in culture bottles   Culture   Final    NO GROWTH 2 DAYS Performed at [facility], [address] [place], [place] [address]    Report Status PENDING  Incomplete  Blood Culture (routine x 2)     Status: None (Preliminary result)   Collection Time: 01/17/24  2:20 PM   Specimen: BLOOD  Result Value Ref Range Status   Specimen Description BLOOD RIGHT ANTECUBITAL  Final   Special Requests   Final    BOTTLES DRAWN AEROBIC AND ANAEROBIC Blood Culture results may not be optimal due to an inadequate volume of blood received in culture bottles   Culture   Final    NO GROWTH 2 DAYS Performed at [facility], [address], [place], [place] [address]    Report Status PENDING  Incomplete  Resp panel by RT-PCR (RSV, Flu A&B, Covid)     Status: None   Collection Time: 01/17/24  2:46 PM   Specimen: Nasal Swab  Result Value Ref Range Status   SARS Coronavirus 2 by RT PCR NEGATIVE NEGATIVE Final   Influenza A by PCR NEGATIVE NEGATIVE Final   Influenza B by PCR NEGATIVE NEGATIVE Final    Comment: (NOTE) The Xpert Xpress SARS-CoV-2/FLU/RSV plus assay is intended as an aid in the diagnosis of influenza from Nasopharyngeal swab specimens and should not be used as a sole basis for treatment. Nasal washings and aspirates are unacceptable for Xpert Xpress SARS-CoV-2/FLU/RSV testing.  Fact Sheet for Patients: BloggerCourse.com  Fact Sheet for Healthcare Providers: SeriousBroker.it  This test is not yet approved or cleared by the [place]  FDA and has been authorized for detection and/or diagnosis of SARS-CoV-2 by FDA under an Emergency Use Authorization (EUA). This EUA will remain in  effect (meaning this test can be used) for the duration of the COVID-19 declaration under Section 564(b)(1) of the Act, 21 U.S.C. section 360bbb-3(b)(1), unless the authorization is terminated or revoked.     Resp Syncytial Virus by PCR NEGATIVE NEGATIVE Final    Comment: (NOTE) Fact Sheet for Patients: BloggerCourse.com  Fact Sheet for Healthcare Providers: SeriousBroker.it  This test is not yet approved or cleared by the [place]  FDA and has been authorized for detection and/or diagnosis of SARS-CoV-2  by FDA under an Emergency Use Authorization (EUA). This EUA will remain in effect (meaning this test can be used) for the duration of the COVID-19 declaration under Section 564(b)(1) of the Act, 21 U.S.C. section 360bbb-3(b)(1), unless the authorization is terminated or revoked.  Performed at [facility], [address], [place], [place] [address]      Time coordinating discharge: 35 minutes  SIGNED:   Vada Garibaldi, MD  [facility] 01/19/2024, 10:08 AM

## 2024-01-19 NOTE — Plan of Care (Signed)

## 2024-01-21 ENCOUNTER — Ambulatory Visit: Payer: Self-pay | Admitting: Emergency Medicine

## 2024-01-22 LAB — CULTURE, BLOOD (ROUTINE X 2)
Culture: NO GROWTH
Culture: NO GROWTH

## 2024-02-07 ENCOUNTER — Ambulatory Visit: Admitting: Podiatry

## 2024-02-07 ENCOUNTER — Encounter: Payer: Self-pay | Admitting: Podiatry

## 2024-02-07 DIAGNOSIS — M79671 Pain in right foot: Secondary | ICD-10-CM

## 2024-02-07 DIAGNOSIS — M79672 Pain in left foot: Secondary | ICD-10-CM | POA: Diagnosis not present

## 2024-02-07 DIAGNOSIS — B351 Tinea unguium: Secondary | ICD-10-CM | POA: Diagnosis not present

## 2024-02-07 NOTE — Progress Notes (Signed)
 Patient presents for evaluation and treatment of tenderness and some redness around nails feet.  Tenderness around toes with walking and wearing shoes.  Physical exam:  General appearance: Alert, pleasant, and in no acute distress.  Vascular: Pedal pulses: DP 2/4, PT 0/4.  Moderate edema lower legs bilaterally  Neurological:  No paresthesias or burning noted.  Dermatologic:  Nails thickened, disfigured, discolored 1-5 BL with subungual debris.  Redness and hypertrophic nail folds along nail folds bilaterally but no signs of drainage or infection.  Musculoskeletal:     Diagnosis: 1. Painful onychomycotic nails 1 through 5 bilaterally. 2. Pain toes 1 through 5 bilaterally.  Plan: Debrided onychomycotic nails 1 through 5 bilaterally.  Return 3 months

## 2024-02-25 ENCOUNTER — Other Ambulatory Visit: Payer: Self-pay | Admitting: Cardiology

## 2024-02-27 ENCOUNTER — Other Ambulatory Visit: Payer: Self-pay

## 2024-02-27 DIAGNOSIS — D45 Polycythemia vera: Secondary | ICD-10-CM

## 2024-02-28 ENCOUNTER — Inpatient Hospital Stay: Attending: Nurse Practitioner

## 2024-02-28 ENCOUNTER — Inpatient Hospital Stay: Admitting: Adult Health

## 2024-02-28 VITALS — BP 143/75 | HR 92 | Temp 98.1°F | Resp 18 | Ht 63.0 in | Wt 174.8 lb

## 2024-02-28 DIAGNOSIS — D45 Polycythemia vera: Secondary | ICD-10-CM | POA: Insufficient documentation

## 2024-02-28 LAB — CBC WITH DIFFERENTIAL (CANCER CENTER ONLY)
Abs Immature Granulocytes: 0.04 10*3/uL (ref 0.00–0.07)
Basophils Absolute: 0.1 10*3/uL (ref 0.0–0.1)
Basophils Relative: 1 %
Eosinophils Absolute: 0.2 10*3/uL (ref 0.0–0.5)
Eosinophils Relative: 3 %
HCT: 45.4 % (ref 36.0–46.0)
Hemoglobin: 15.8 g/dL — ABNORMAL HIGH (ref 12.0–15.0)
Immature Granulocytes: 1 %
Lymphocytes Relative: 24 %
Lymphs Abs: 1.6 10*3/uL (ref 0.7–4.0)
MCH: 28.5 pg (ref 26.0–34.0)
MCHC: 34.8 g/dL (ref 30.0–36.0)
MCV: 81.9 fL (ref 80.0–100.0)
Monocytes Absolute: 0.7 10*3/uL (ref 0.1–1.0)
Monocytes Relative: 11 %
Neutro Abs: 4.1 10*3/uL (ref 1.7–7.7)
Neutrophils Relative %: 60 %
Platelet Count: 387 10*3/uL (ref 150–400)
RBC: 5.54 MIL/uL — ABNORMAL HIGH (ref 3.87–5.11)
RDW: 14.7 % (ref 11.5–15.5)
WBC Count: 6.7 10*3/uL (ref 4.0–10.5)
nRBC: 0 % (ref 0.0–0.2)

## 2024-02-28 NOTE — Progress Notes (Signed)
 [facility] Cancer Follow up:    Donna Chamorro, MD [address] 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   Treatment: Phlebotomy Oct/2024 (only half a unit could be removed because of symptoms from patient with nausea)   Goal: Symptom control and to keep hemoglobin less than 18   CURRENT THERAPY:intermittent phlebotomy  INTERVAL HISTORY:  Discussed the use of AI scribe software for clinical note transcription with the patient, who gave verbal consent to proceed.  History of Present Illness Donna Kirk is a 78 year old female with polycythemia who presents for follow-up.  She is undergoing intermittent phlebotomy to manage her polycythemia, with a target hemoglobin level below 18 g/dL. Her most recent hemoglobin level today is 15.8 g/dL. She experiences persistent fatigue, requiring frequent rest after minimal activity. Recent blood work shows no significant abnormalities, with normal white blood cells and platelets.  She was hospitalized in early may for right arm cellulitis, she has since recovered.    Hemoglobin monitoring for the past year as follows:   Latest Reference Range & Units 03/08/23 11:23 06/14/23 15:38 08/23/23 09:47 12/06/23 10:07 01/17/24 13:28 01/18/24 03:37 01/19/24 07:44 02/28/24 10:20  Hemoglobin 12.0 - 15.0 g/dL 95.6 21.3 (H) 08.6 (H) 15.9 (H) 16.5 (H) 13.8 14.4 15.8 (H)  (H): Data is abnormally high  Patient Active Problem List   Diagnosis Date Noted   Cellulitis and abscess of hand 01/18/2024   Hyperlipidemia 01/17/2024   Cellulitis of right upper extremity 01/17/2024   Hypothyroidism    Asymmetric septal hypertrophy 11/28/2023   Polycythemia vera (HCC) 07/10/2022   RBBB 04/10/2020   Hypertrophic cardiomyopathy (HCC) 04/10/2020   HTN (hypertension) 06/22/2014    Abnormal CXR 06/22/2014   Abnormal EKG 06/22/2014   Postoperative breast asymmetry 07/03/2012    has no known allergies.  MEDICAL HISTORY: Past Medical History:  Diagnosis Date   Breast cancer (HCC)    Hypertension    Hypothyroidism    Polycythemia vera (HCC) 07/10/2022    SURGICAL HISTORY: Past Surgical History:  Procedure Laterality Date   ABDOMINAL HYSTERECTOMY     BREAST CYST EXCISION  07/03/2012   BREAST SURGERY  1993   right mastectomy-nodes removed   COLONOSCOPY     x2   DE QUERVAIN'S RELEASE  2009   right   DE QUERVAIN'S RELEASE  2006   left   MASTOPEXY  07/03/2012   Procedure: MASTOPEXY;  Surgeon: Marilou Showman, DO;  Location: [facility];  Service: Plastics;  Laterality: Left;  left breast mastopexy reduction, left lateral breast excision with liposuction for symmetry   TUBAL LIGATION      SOCIAL HISTORY: Social History   Socioeconomic History   Marital status: Married    Spouse name: Not on file   Number of children: 2   Years of education: Not on file   Highest education level: Not on file  Occupational History   Not on file  Tobacco Use   Smoking status: Never   Smokeless tobacco: Never  Substance and Sexual Activity   Alcohol use: No   Drug use: No   Sexual activity: Not on file  Other Topics Concern   Not on file  Social History Narrative   Lives with husband and youngest son.     Social Drivers of Dispensing optician  Resource Strain: Not on file  Food Insecurity: No Food Insecurity (01/17/2024)   Hunger Vital Sign    Worried About Running Out of Food in the Last Year: Never true    Ran Out of Food in the Last Year: Never true  Transportation Needs: No Transportation Needs (01/17/2024)   PRAPARE - Administrator, Civil Service (Medical): No    Lack of Transportation (Non-Medical): No  Physical Activity: Not on file  Stress: Not on file  Social Connections: Moderately Integrated (01/17/2024)   Social Connection  and Isolation Panel    Frequency of Communication with Friends and Family: Twice a week    Frequency of Social Gatherings with Friends and Family: Twice a week    Attends Religious Services: 1 to 4 times per year    Active Member of [employer] or Organizations: No    Attends Banker Meetings: Never    Marital Status: Married  Catering manager Violence: Not At Risk (01/17/2024)   Humiliation, Afraid, Rape, and Kick questionnaire    Fear of Current or Ex-Partner: No    Emotionally Abused: No    Physically Abused: No    Sexually Abused: No    FAMILY HISTORY: Family History  Problem Relation Age of Onset   CAD Father     Review of Systems  Constitutional:  Positive for fatigue. Negative for appetite change, chills, fever and unexpected weight change.  HENT:   Negative for hearing loss, lump/mass and trouble swallowing.   Eyes:  Negative for eye problems and icterus.  Respiratory:  Negative for chest tightness, cough and shortness of breath.   Cardiovascular:  Negative for chest pain, leg swelling and palpitations.  Gastrointestinal:  Negative for abdominal distention, abdominal pain, constipation, diarrhea, nausea and vomiting.  Endocrine: Negative for hot flashes.  Genitourinary:  Negative for difficulty urinating.   Musculoskeletal:  Negative for arthralgias.  Skin:  Negative for itching and rash.  Neurological:  Negative for dizziness, extremity weakness, headaches and numbness.  Hematological:  Negative for adenopathy. Does not bruise/bleed easily.  Psychiatric/Behavioral:  Negative for depression. The patient is not nervous/anxious.       PHYSICAL EXAMINATION    Vitals:   02/28/24 1045  BP: (!) 143/75  Pulse: 92  Resp: 18  Temp: 98.1 F (36.7 C)  SpO2: 97%    Physical Exam Constitutional:      General: She is not in acute distress.    Appearance: Normal appearance. She is not toxic-appearing.  HENT:     Head: Normocephalic and atraumatic.      Mouth/Throat:     Mouth: Mucous membranes are moist.     Pharynx: Oropharynx is clear. No oropharyngeal exudate or posterior oropharyngeal erythema.   Eyes:     General: No scleral icterus.   Cardiovascular:     Rate and Rhythm: Normal rate and regular rhythm.     Pulses: Normal pulses.     Heart sounds: Normal heart sounds.  Pulmonary:     Effort: Pulmonary effort is normal.     Breath sounds: Normal breath sounds.  Abdominal:     General: Abdomen is flat. Bowel sounds are normal. There is no distension.     Palpations: Abdomen is soft.     Tenderness: There is no abdominal tenderness.   Musculoskeletal:        General: No swelling.     Cervical back: Neck supple.  Lymphadenopathy:     Cervical: No cervical adenopathy.   Skin:  General: Skin is warm and dry.     Findings: No rash.   Neurological:     General: No focal deficit present.     Mental Status: She is alert.   Psychiatric:        Mood and Affect: Mood normal.        Behavior: Behavior normal.     LABORATORY DATA:  CBC    Component Value Date/Time   WBC 6.7 02/28/2024 1020   WBC 6.6 01/19/2024 0744   RBC 5.54 (H) 02/28/2024 1020   HGB 15.8 (H) 02/28/2024 1020   HCT 45.4 02/28/2024 1020   PLT 387 02/28/2024 1020   MCV 81.9 02/28/2024 1020   MCH 28.5 02/28/2024 1020   MCHC 34.8 02/28/2024 1020   RDW 14.7 02/28/2024 1020   LYMPHSABS 1.6 02/28/2024 1020   MONOABS 0.7 02/28/2024 1020   EOSABS 0.2 02/28/2024 1020   BASOSABS 0.1 02/28/2024 1020    CMP     Component Value Date/Time   NA 141 01/19/2024 0744   NA 142 04/15/2019 1158   K 3.6 01/19/2024 0744   CL 106 01/19/2024 0744   CO2 25 01/19/2024 0744   GLUCOSE 104 (H) 01/19/2024 0744   BUN 12 01/19/2024 0744   BUN 17 04/15/2019 1158   CREATININE 0.90 01/19/2024 0744   CALCIUM 9.0 01/19/2024 0744   PROT 7.1 01/17/2024 1328   ALBUMIN 4.3 01/17/2024 1328   AST 30 01/17/2024 1328   ALT 22 01/17/2024 1328   ALKPHOS 84 01/17/2024 1328    BILITOT 2.2 (H) 01/17/2024 1328   GFRNONAA >60 01/19/2024 0744   GFRAA 67 04/15/2019 1158     ASSESSMENT and THERAPY PLAN:   Polycythemia vera (HCC) Lab review: 05/31/2022: Hemoglobin 15.8, hematocrit 46.9, WBC 7.4, platelets 365 06/14/2023: Hemoglobin 15.7, hematocrit 45.8  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  Treatment: Phlebotomy Oct/2024 (only half a unit could be removed because of symptoms from patient with nausea)  Goal: Symptom control and to keep hemoglobin less than 18 Treatment plan: Check labs and phlebotomy as needed   Assessment and Plan Assessment & Plan Polycythemia Polycythemia with mildly elevated hemoglobin at 15.8. Persistent fatigue likely unrelated to polycythemia. Hemoglobin goal is below 18. - Monitor hemoglobin levels. - Follow up in 3 months for labs and evaluation. - Advise to contact the clinic if concerns arise before the next appointment. - Recommend f/u with PCP about her fatigue       All questions were answered. The patient knows to call the clinic with any problems, questions or concerns. We can certainly see the patient much sooner if necessary.  Total encounter time:20 minutes*in face-to-face visit time, chart review, lab review, care coordination, order entry, and documentation of the encounter time.    Alwin Baars, NP 02/28/24 11:20 AM Medical Oncology and Hematology [facility] [address] [place], [place] [address] Tel. [phone]    Fax. [phone]  *Total Encounter Time as defined by the Centers for Medicare and Medicaid Services includes, in addition to the face-to-face time of a patient visit (documented in the note above) non-face-to-face time: obtaining and reviewing outside history, ordering and reviewing medications, tests or procedures, care coordination (communications with other health care professionals or caregivers) and documentation in the  medical record.

## 2024-02-28 NOTE — Assessment & Plan Note (Signed)
 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  Treatment: Phlebotomy Oct/2024 (only half a unit could be removed because of symptoms from patient with nausea)  Goal: Symptom control and to keep hemoglobin less than 18 Treatment plan: Check labs and phlebotomy as needed   Assessment and Plan Assessment & Plan Polycythemia Polycythemia with mildly elevated hemoglobin at 15.8. Persistent fatigue likely unrelated to polycythemia. Hemoglobin goal is below 18. - Monitor hemoglobin levels. - Follow up in 3 months for labs and evaluation. - Advise to contact the clinic if concerns arise before the next appointment. - Recommend f/u with PCP about her fatigue

## 2024-05-01 ENCOUNTER — Encounter (HOSPITAL_BASED_OUTPATIENT_CLINIC_OR_DEPARTMENT_OTHER): Payer: Self-pay | Admitting: Emergency Medicine

## 2024-05-01 ENCOUNTER — Emergency Department (HOSPITAL_BASED_OUTPATIENT_CLINIC_OR_DEPARTMENT_OTHER)
Admission: EM | Admit: 2024-05-01 | Discharge: 2024-05-01 | Disposition: A | Discharge status: Home / Self Care | Attending: Emergency Medicine | Admitting: Emergency Medicine

## 2024-05-01 ENCOUNTER — Emergency Department (HOSPITAL_BASED_OUTPATIENT_CLINIC_OR_DEPARTMENT_OTHER)

## 2024-05-01 ENCOUNTER — Other Ambulatory Visit: Payer: Self-pay

## 2024-05-01 DIAGNOSIS — E039 Hypothyroidism, unspecified: Secondary | ICD-10-CM | POA: Diagnosis not present

## 2024-05-01 DIAGNOSIS — Z853 Personal history of malignant neoplasm of breast: Secondary | ICD-10-CM | POA: Insufficient documentation

## 2024-05-01 DIAGNOSIS — K5792 Diverticulitis of intestine, part unspecified, without perforation or abscess without bleeding: Secondary | ICD-10-CM

## 2024-05-01 DIAGNOSIS — R109 Unspecified abdominal pain: Secondary | ICD-10-CM | POA: Diagnosis present

## 2024-05-01 DIAGNOSIS — N390 Urinary tract infection, site not specified: Secondary | ICD-10-CM | POA: Diagnosis not present

## 2024-05-01 DIAGNOSIS — I10 Essential (primary) hypertension: Secondary | ICD-10-CM | POA: Insufficient documentation

## 2024-05-01 LAB — CBC WITH DIFFERENTIAL/PLATELET
Abs Immature Granulocytes: 0.04 K/uL (ref 0.00–0.07)
Basophils Absolute: 0.1 K/uL (ref 0.0–0.1)
Basophils Relative: 1 %
Eosinophils Absolute: 0.1 K/uL (ref 0.0–0.5)
Eosinophils Relative: 1 %
HCT: 44.9 % (ref 36.0–46.0)
Hemoglobin: 15.4 g/dL — ABNORMAL HIGH (ref 12.0–15.0)
Immature Granulocytes: 0 %
Lymphocytes Relative: 17 %
Lymphs Abs: 1.5 K/uL (ref 0.7–4.0)
MCH: 28.5 pg (ref 26.0–34.0)
MCHC: 34.3 g/dL (ref 30.0–36.0)
MCV: 83 fL (ref 80.0–100.0)
Monocytes Absolute: 0.8 K/uL (ref 0.1–1.0)
Monocytes Relative: 9 %
Neutro Abs: 6.7 K/uL (ref 1.7–7.7)
Neutrophils Relative %: 72 %
Platelets: 367 K/uL (ref 150–400)
RBC: 5.41 MIL/uL — ABNORMAL HIGH (ref 3.87–5.11)
RDW: 14.6 % (ref 11.5–15.5)
WBC: 9.2 K/uL (ref 4.0–10.5)
nRBC: 0 % (ref 0.0–0.2)

## 2024-05-01 LAB — URINALYSIS, ROUTINE W REFLEX MICROSCOPIC
Bilirubin Urine: NEGATIVE
Glucose, UA: NEGATIVE mg/dL
Hgb urine dipstick: NEGATIVE
Ketones, ur: NEGATIVE mg/dL
Nitrite: NEGATIVE
Protein, ur: NEGATIVE mg/dL
Specific Gravity, Urine: <=1.005 (ref 1.005–1.030)
pH: 7 (ref 5.0–8.0)

## 2024-05-01 LAB — COMPREHENSIVE METABOLIC PANEL WITH GFR
ALT: 15 U/L (ref 0–44)
AST: 28 U/L (ref 15–41)
Albumin: 4.4 g/dL (ref 3.5–5.0)
Alkaline Phosphatase: 94 U/L (ref 38–126)
Anion gap: 14 (ref 5–15)
BUN: 13 mg/dL (ref 8–23)
CO2: 24 mmol/L (ref 22–32)
Calcium: 9.2 mg/dL (ref 8.9–10.3)
Chloride: 102 mmol/L (ref 98–111)
Creatinine, Ser: 1.02 mg/dL — ABNORMAL HIGH (ref 0.44–1.00)
GFR, Estimated: 56 mL/min — ABNORMAL LOW (ref >60)
Glucose, Bld: 117 mg/dL — ABNORMAL HIGH (ref 70–99)
Potassium: 3.4 mmol/L — ABNORMAL LOW (ref 3.5–5.1)
Sodium: 140 mmol/L (ref 135–145)
Total Bilirubin: 1.4 mg/dL — ABNORMAL HIGH (ref 0.0–1.2)
Total Protein: 7 g/dL (ref 6.5–8.1)

## 2024-05-01 LAB — URINALYSIS, MICROSCOPIC (REFLEX)

## 2024-05-01 LAB — LIPASE, BLOOD: Lipase: 32 U/L (ref 11–51)

## 2024-05-01 MED ORDER — IOHEXOL 300 MG/ML  SOLN
100.0000 mL | Freq: Once | INTRAMUSCULAR | Status: AC | PRN
  Administered 2024-05-01: 100 mL via INTRAVENOUS

## 2024-05-01 MED ORDER — CIPROFLOXACIN HCL 500 MG PO TABS
500.0000 mg | ORAL_TABLET | Freq: Once | ORAL | Status: AC
  Administered 2024-05-01: 500 mg via ORAL
  Filled 2024-05-01: qty 1

## 2024-05-01 MED ORDER — SODIUM CHLORIDE 0.9 % IV BOLUS
500.0000 mL | Freq: Once | INTRAVENOUS | Status: AC
  Administered 2024-05-01: 500 mL via INTRAVENOUS

## 2024-05-01 MED ORDER — METRONIDAZOLE 500 MG PO TABS
500.0000 mg | ORAL_TABLET | Freq: Once | ORAL | Status: AC
  Administered 2024-05-01: 500 mg via ORAL
  Filled 2024-05-01: qty 1

## 2024-05-01 MED ORDER — CIPROFLOXACIN HCL 500 MG PO TABS: 500.0000 mg | ORAL_TABLET | Freq: Two times a day (BID) | ORAL | 0 refills | Status: AC

## 2024-05-01 MED ORDER — METRONIDAZOLE 500 MG PO TABS
500.0000 mg | ORAL_TABLET | Freq: Two times a day (BID) | ORAL | 0 refills | Status: AC
Start: 2024-05-01 — End: 2024-05-05

## 2024-05-01 NOTE — ED Provider Notes (Signed)
  Physical Exam  BP 135/71   Pulse 87   Temp 97.6 F (36.4 C)   Resp 17   Ht 5' 3 (1.6 m)   Wt 78.9 kg   SpO2 99%   BMI 30.82 kg/m   Physical Exam Vitals and nursing note reviewed.  HENT:     Head: Normocephalic and atraumatic.  Eyes:     Pupils: Pupils are equal, round, and reactive to light.  Cardiovascular:     Rate and Rhythm: Normal rate and regular rhythm.  Pulmonary:     Effort: Pulmonary effort is normal.     Breath sounds: Normal breath sounds.  Abdominal:     Palpations: Abdomen is soft.     Tenderness: There is no abdominal tenderness.  Skin:    General: Skin is warm and dry.  Neurological:     Mental Status: She is alert.  Psychiatric:        Mood and Affect: Mood normal.     Procedures  Procedures  ED Course / MDM   Clinical Course as of 05/01/24 1641  Fri May 01, 2024  1640 CT abdomen pelvis may show early diverticulitis.  No abscess or signs of severe illness.  Given concern for UTI and potentially superimposed diverticulitis.  Will cover with antibiotics.  Will prescribe Cipro  Flagyl  give first dose here.  She will follow-up with PCP.  Appropriate for discharge. [MP]    Clinical Course User Index [MP] Pamella Ozell LABOR, DO   Medical Decision Making I, Ozell Pamella DO, have assumed care of this patient from the previous provider pending CT abdomen pelvis reevaluation and disposition.  Will send urine for culture given concern for UTI  Amount and/or Complexity of Data Reviewed Labs: ordered. Radiology: ordered.  Risk Prescription drug management.          Pamella Ozell LABOR, DO 05/01/24 [phone]

## 2024-05-01 NOTE — Discharge Instructions (Addendum)
 You were seen in the emergency room for abdominal pain and diarrhea You may have a urinary tract infection but we need to send this for culture to confirm if there is bacteria growing The CAT scan showed diverticulosis and may be acute diverticulitis For this reason we discharged you on 2 different antibiotics We gave your first dose here Please pick up the antibiotics from your [facility] pharmacy and take as directed for the next 4 days to treat both the urinary tract infection and diverticulitis Return to the emergency department for severe abdominal pain, fevers or any other concerns Otherwise follow-up with your primary care doctor in 1 week for reevaluation

## 2024-05-01 NOTE — ED Provider Notes (Signed)
 Emergency Department Provider Note   I have reviewed the triage vital signs and the nursing notes.   HISTORY  Chief Complaint Flank Pain   HPI Donna Kirk is a 78 y.o. female past history reviewed below presents emergency department with left abdominal pain and diarrhea.  Left flank and abdominal pain been ongoing for the past 2 days.  Diarrhea began this morning.  She had some mild nausea but no vomiting.  No dysuria, hesitancy, urgency.  No fevers or chills.  Pain is worse with movement.  At rest, she has no discomfort in the abdomen currently.  No radiation into the chest.   Past Medical History:  Diagnosis Date   Breast cancer (HCC)    Hypertension    Hypothyroidism    Polycythemia vera (HCC) 07/10/2022    Review of Systems  Constitutional: No fever/chills Cardiovascular: Denies chest pain. Respiratory: Denies shortness of breath. Gastrointestinal: Positive left abdominal pain. Mild nausea, no vomiting.  Positive diarrhea.  No constipation. Genitourinary: Negative for dysuria. Musculoskeletal: Negative for back pain. Skin: Negative for rash. Neurological: Negative for headaches.  ____________________________________________   PHYSICAL EXAM:  VITAL SIGNS: ED Triage Vitals  Encounter Vitals Group     BP 05/01/24 1144 (!) 164/83     Pulse Rate 05/01/24 1144 98     Resp 05/01/24 1144 18     Temp 05/01/24 1144 97.6 F (36.4 C)     Temp src --      SpO2 05/01/24 1144 98 %     Weight 05/01/24 1143 174 lb (78.9 kg)     Height 05/01/24 1143 5' 3 (1.6 m)   Constitutional: Alert and oriented. Well appearing and in no acute distress. Eyes: Conjunctivae are normal.  Head: Atraumatic. Nose: No congestion/rhinnorhea. Mouth/Throat: Mucous membranes are moist.   Neck: No stridor.  Cardiovascular: Normal rate, regular rhythm. Good peripheral circulation. Grossly normal heart sounds.   Respiratory: Normal respiratory effort.  No retractions. Lungs  CTAB. Gastrointestinal: Soft with left abdominal tenderness. No peritonitis. No distention.  Musculoskeletal: No gross deformities of extremities. Neurologic:  Normal speech and language.  Skin:  Skin is warm, dry and intact. No rash noted.   ____________________________________________   LABS (all labs ordered are listed, but only abnormal results are displayed)  Labs Reviewed  URINE CULTURE - Abnormal; Notable for the following components:      Result Value   Culture MULTIPLE SPECIES PRESENT, SUGGEST RECOLLECTION (*)    All other components within normal limits  URINALYSIS, ROUTINE W REFLEX MICROSCOPIC - Abnormal; Notable for the following components:   Color, Urine STRAW (*)    Leukocytes,Ua MODERATE (*)    All other components within normal limits  COMPREHENSIVE METABOLIC PANEL WITH GFR - Abnormal; Notable for the following components:   Potassium 3.4 (*)    Glucose, Bld 117 (*)    Creatinine, Ser 1.02 (*)    Total Bilirubin 1.4 (*)    GFR, Estimated 56 (*)    All other components within normal limits  CBC WITH DIFFERENTIAL/PLATELET - Abnormal; Notable for the following components:   RBC 5.41 (*)    Hemoglobin 15.4 (*)    All other components within normal limits  URINALYSIS, MICROSCOPIC (REFLEX) - Abnormal; Notable for the following components:   Bacteria, UA FEW (*)    All other components within normal limits  LIPASE, BLOOD    ____________________________________________   PROCEDURES  Procedure(s) performed:   Procedures  None  ____________________________________________   INITIAL IMPRESSION /  ASSESSMENT AND PLAN / ED COURSE  Pertinent labs & imaging results that were available during my care of the patient were reviewed by me and considered in my medical decision making (see chart for details).   This patient is Presenting for Evaluation of abdominal pain, which does require a range of treatment options, and is a complaint that involves a high risk of  morbidity and mortality.  The Differential Diagnoses includes but is not exclusive to  acute appendicitis, urinary tract infection, bowel obstruction, hernia, colitis, renal colic, gastroenteritis, volvulus etc.   Critical Interventions-    Medications  sodium chloride  0.9 % bolus 500 mL (0 mLs Intravenous Stopped 05/01/24 1347)  iohexol  (OMNIPAQUE ) 300 MG/ML solution 100 mL (100 mLs Intravenous Contrast Given 05/01/24 1515)  ciprofloxacin  (CIPRO ) tablet 500 mg (500 mg Oral Given 05/01/24 1646)  metroNIDAZOLE  (FLAGYL ) tablet 500 mg (500 mg Oral Given 05/01/24 1646)    Reassessment after intervention:  symptoms improved.    Clinical Laboratory Tests Ordered, included   Radiologic Tests Ordered, included CT abdomen/pelvis. I independently interpreted the images and agree with radiology interpretation.   Cardiac Monitor Tracing which shows NSR.    Social Determinants of Health Risk patient is a non-smoker.   Medical Decision Making: Summary:  The patient presents to the emergency department for evaluation of abdominal pain on the left with diarrhea this morning.  Differential is broad as above.  Plan for CT imaging given patient's age and focal tenderness on exam.  No SIRS vitals or concern for developing sepsis.  Reevaluation with update and discussion with patient. Care transferred to Dr. Pamella pending CT.    Patient's presentation is most consistent with acute presentation with potential threat to life or bodily function.   Disposition: pending   ____________________________________________  FINAL CLINICAL IMPRESSION(S) / ED DIAGNOSES  Final diagnoses:  Lower urinary tract infectious disease  Diverticulitis     NEW OUTPATIENT MEDICATIONS STARTED DURING THIS VISIT:  Discharge Medication List as of 05/01/2024  4:41 PM     START taking these medications   Details  ciprofloxacin  (CIPRO ) 500 MG tablet Take 1 tablet (500 mg total) by mouth every 12 (twelve) hours for 4 days.,  Starting Fri 05/01/2024, Until Tue 05/05/2024, Normal    metroNIDAZOLE  (FLAGYL ) 500 MG tablet Take 1 tablet (500 mg total) by mouth 2 (two) times daily for 4 days., Starting Fri 05/01/2024, Until Tue 05/05/2024, Normal        Note:  This document was prepared using Dragon voice recognition software and may include unintentional dictation errors.  Fonda Law, MD, [facility] Emergency Medicine    Kuuipo Anzaldo, Fonda MATSU, MD 05/04/24 [phone]

## 2024-05-01 NOTE — ED Triage Notes (Addendum)
 Pt c/o L flank pain x 2 days. Diarrhea today x1.  Denies emesis, dysuria, fever.  Pain worse with bending over.

## 2024-05-02 LAB — URINE CULTURE

## 2024-05-08 ENCOUNTER — Encounter: Payer: Self-pay | Admitting: Podiatry

## 2024-05-08 ENCOUNTER — Ambulatory Visit: Admitting: Podiatry

## 2024-05-08 DIAGNOSIS — M79672 Pain in left foot: Secondary | ICD-10-CM | POA: Diagnosis not present

## 2024-05-08 DIAGNOSIS — B351 Tinea unguium: Secondary | ICD-10-CM | POA: Diagnosis not present

## 2024-05-08 DIAGNOSIS — M79671 Pain in right foot: Secondary | ICD-10-CM | POA: Diagnosis not present

## 2024-05-08 DIAGNOSIS — L600 Ingrowing nail: Secondary | ICD-10-CM

## 2024-05-08 MED ORDER — CEPHALEXIN 500 MG PO CAPS: 1000.0000 mg | ORAL_CAPSULE | Freq: Two times a day (BID) | ORAL | 0 refills | Status: AC

## 2024-05-08 NOTE — Patient Instructions (Signed)

## 2024-05-08 NOTE — Progress Notes (Signed)
 Patient presents for evaluation and treatment of tenderness and some redness around nails feet.  Tenderness around toes with walking and wearing shoes.  Complains of a severe pain at the medial border of the hallux nail right.  Has been getting red.  She has noticed some crusting.  Physical exam:  General appearance: Alert, pleasant, and in no acute distress.  Vascular: Pedal pulses: DP 2/4 B/L, PT 0/4 B/L.  Moderate edema lower legs bilaterally  Neu  Dermatologic:  Nails thickened, disfigured, discolored 1-5 BL with subungual debris.  Redness and hypertrophic nail folds along nail folds bilaterally but no signs of drainage or infection.  Skin thin atrophic with no hair growth lower extremity bilaterally.  Ingrown medial nail border hallux nail right with incurvated borders redness on the nail fold and some clear serous fluid.  Musculoskeletal:     Diagnosis: 1. Painful onychomycotic nails 1 through 5 bilaterally. 2. Pain toes 1 through 5 bilaterally.  Plan: -Discussed with her the ingrown nail on the medial border of the hallux nail right.  Will have her soak it twice daily warm Epsom salt water apply Neosporin and a light dressing.  Also prescribe her an antibiotic.  Discussed with ingrown nail we will see her in 1 week and do a matrixectomy on the medial border of the -Rx Keflex  500 mg, 2 p.o. twice daily 7 days -Debrided onychomycotic nails 1 through 5 bilaterally.  Sharply debrided nails with nail clipper and reduced with a power bur.  Return 1 week matrixectomy medial border hallux nail right Return 3 months RFC

## 2024-05-15 ENCOUNTER — Ambulatory Visit (INDEPENDENT_AMBULATORY_CARE_PROVIDER_SITE_OTHER): Admitting: Podiatry

## 2024-05-15 VITALS — Ht 63.0 in | Wt 174.0 lb

## 2024-05-15 DIAGNOSIS — L600 Ingrowing nail: Secondary | ICD-10-CM

## 2024-05-15 NOTE — Progress Notes (Signed)
 Patient complains of painful ingrown medial border(s) hallux right. Patient denies fevers, chills, nausea, vomiting.  Objective:  Vitals: Reviewed  General: Well developed, nourished, in no acute distress, alert and oriented x3   Vascular: DP pulse 2/4 bilateral. PT pulse 2/4 bilateral. Mild edema  hallux RT  Dermatology: Erythema, edema, incurvated nail border medial hallux right with clear drainage . Tenderness present with palpation. Normal skin tone and texture feet with normal hair growth.  Neurological: Grossly intact. Normal reflexes.   Musculoskeletal: Tenderness with palpation of the distal hallux right. No tenderness or painful ROM at IPJ.  Diagnosis: Ingrown nail medial hallux right  Plan: -discussed etiology and treatment of ingrown nails. Discussed surgical vs conservative treatment. -Consent signed for appropriate matrixectomy affected nail(s).   Procedure(s):   - Matrixectomy(s) hallux right medial border: Toe anesthetized with 3cc 2:1 mixture 2% Lidocaine  with epinephrine: Sodium Bicarbonate. Surgical site prepped. Digital tourniquet applied.  Avulsion of nail border. performed. Matrixecomy performed with three 30 second applications of phenol to nail matrix. Site irrigated with alcohol.  Tourniquet released with good vascularity noticed in digit.  Applied triple antibiotic to nailbed and applied gauze and Coban dressing. - Written and oral postoperative instructions given.  -Return for post-op 2 weeks.  JINNY Prentice Binder, DPM

## 2024-05-15 NOTE — Patient Instructions (Signed)

## 2024-05-21 ENCOUNTER — Other Ambulatory Visit: Payer: Self-pay | Admitting: *Deleted

## 2024-05-21 DIAGNOSIS — D45 Polycythemia vera: Secondary | ICD-10-CM

## 2024-05-22 ENCOUNTER — Inpatient Hospital Stay: Attending: Nurse Practitioner

## 2024-05-22 ENCOUNTER — Inpatient Hospital Stay (HOSPITAL_BASED_OUTPATIENT_CLINIC_OR_DEPARTMENT_OTHER): Admitting: Adult Health

## 2024-05-22 ENCOUNTER — Encounter: Payer: Self-pay | Admitting: Adult Health

## 2024-05-22 VITALS — BP 135/63 | HR 77 | Temp 98.3°F | Resp 18 | Ht 63.0 in | Wt 174.3 lb

## 2024-05-22 DIAGNOSIS — D45 Polycythemia vera: Secondary | ICD-10-CM

## 2024-05-22 DIAGNOSIS — E876 Hypokalemia: Secondary | ICD-10-CM | POA: Diagnosis not present

## 2024-05-22 DIAGNOSIS — R739 Hyperglycemia, unspecified: Secondary | ICD-10-CM | POA: Diagnosis not present

## 2024-05-22 DIAGNOSIS — R5383 Other fatigue: Secondary | ICD-10-CM | POA: Insufficient documentation

## 2024-05-22 DIAGNOSIS — K5730 Diverticulosis of large intestine without perforation or abscess without bleeding: Secondary | ICD-10-CM | POA: Insufficient documentation

## 2024-05-22 DIAGNOSIS — R17 Unspecified jaundice: Secondary | ICD-10-CM | POA: Insufficient documentation

## 2024-05-22 LAB — CBC WITH DIFFERENTIAL (CANCER CENTER ONLY)
Abs Immature Granulocytes: 0.03 K/uL (ref 0.00–0.07)
Basophils Absolute: 0.1 K/uL (ref 0.0–0.1)
Basophils Relative: 1 %
Eosinophils Absolute: 0.1 K/uL (ref 0.0–0.5)
Eosinophils Relative: 2 %
HCT: 46.6 % — ABNORMAL HIGH (ref 36.0–46.0)
Hemoglobin: 15.7 g/dL — ABNORMAL HIGH (ref 12.0–15.0)
Immature Granulocytes: 1 %
Lymphocytes Relative: 23 %
Lymphs Abs: 1.3 K/uL (ref 0.7–4.0)
MCH: 28 pg (ref 26.0–34.0)
MCHC: 33.7 g/dL (ref 30.0–36.0)
MCV: 83.2 fL (ref 80.0–100.0)
Monocytes Absolute: 0.5 K/uL (ref 0.1–1.0)
Monocytes Relative: 9 %
Neutro Abs: 3.6 K/uL (ref 1.7–7.7)
Neutrophils Relative %: 64 %
Platelet Count: 379 K/uL (ref 150–400)
RBC: 5.6 MIL/uL — ABNORMAL HIGH (ref 3.87–5.11)
RDW: 14.6 % (ref 11.5–15.5)
WBC Count: 5.6 K/uL (ref 4.0–10.5)
nRBC: 0 % (ref 0.0–0.2)

## 2024-05-22 LAB — CMP (CANCER CENTER ONLY)
ALT: 17 U/L (ref 0–44)
AST: 22 U/L (ref 15–41)
Albumin: 4.4 g/dL (ref 3.5–5.0)
Alkaline Phosphatase: 88 U/L (ref 38–126)
Anion gap: 6 (ref 5–15)
BUN: 17 mg/dL (ref 8–23)
CO2: 31 mmol/L (ref 22–32)
Calcium: 9.3 mg/dL (ref 8.9–10.3)
Chloride: 101 mmol/L (ref 98–111)
Creatinine: 1 mg/dL (ref 0.44–1.00)
GFR, Estimated: 58 mL/min — ABNORMAL LOW (ref >60)
Glucose, Bld: 150 mg/dL — ABNORMAL HIGH (ref 70–99)
Potassium: 3.3 mmol/L — ABNORMAL LOW (ref 3.5–5.1)
Sodium: 138 mmol/L (ref 135–145)
Total Bilirubin: 1.4 mg/dL — ABNORMAL HIGH (ref 0.0–1.2)
Total Protein: 6.9 g/dL (ref 6.5–8.1)

## 2024-05-22 NOTE — Progress Notes (Signed)
 [facility] Cancer Follow up:    Barbra Odor, NP [address] 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   Treatment: Phlebotomy Oct/2024 (only half a unit could be removed because of symptoms from patient with nausea)   Goal: Symptom control and to keep hemoglobin less than 18  CURRENT THERAPY: intermittent phlebotomy  INTERVAL HISTORY:  Discussed the use of AI scribe software for clinical note transcription with the patient, who gave verbal consent to proceed.  History of Present Illness Donna Kirk is a 78 year old female with polycythemia vera who presents for routine follow-up.  She has polycythemia vera with a JAK2 mutation, managed with intermittent phlebotomy to maintain hemoglobin levels below 18. Her current hemoglobin level is 15.7. She frequently experiences fatigue. No headaches, shortness of breath, chest pain, or palpitations.  Recent lab results show a slightly decreased potassium level, a slightly elevated bilirubin level, and a slightly elevated blood sugar level. She is taking potassium supplements, which she recently refilled.    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  MEDICAL HISTORY: Past Medical History:  Diagnosis Date   Breast cancer (HCC)    Hypertension     Hypothyroidism    Polycythemia vera (HCC) 07/10/2022    SURGICAL HISTORY: Past Surgical History:  Procedure Laterality Date   ABDOMINAL HYSTERECTOMY     BREAST CYST EXCISION  07/03/2012   BREAST SURGERY  1993   right mastectomy-nodes removed   COLONOSCOPY     x2   DE QUERVAIN'S RELEASE  2009   right   DE QUERVAIN'S RELEASE  2006   left   MASTOPEXY  07/03/2012   Procedure: MASTOPEXY;  Surgeon: Estefana Reichert, DO;  Location: [facility];  Service: Plastics;  Laterality: Left;  left breast mastopexy reduction, left lateral breast excision with liposuction for symmetry   TUBAL LIGATION      SOCIAL HISTORY: Social History   Socioeconomic History   Marital status: Married    Spouse name: Not on file   Number of children: 2   Years of education: Not on file   Highest education level: Not on file  Occupational History   Not on file  Tobacco Use   Smoking status: Never   Smokeless tobacco: Never  Substance and Sexual Activity   Alcohol use: No   Drug use: No   Sexual activity: Not on file  Other Topics Concern   Not on file  Social History Narrative   Lives with husband and youngest son.     Social Drivers of Corporate investment banker Strain: Not on file  Food Insecurity: No Food Insecurity (01/17/2024)   Hunger Vital Sign    Worried About Running Out of Food in the Last Year: Never true    Ran Out of Food in the Last Year: Never true  Transportation  Needs: No Transportation Needs (01/17/2024)   PRAPARE - Administrator, Civil Service (Medical): No    Lack of Transportation (Non-Medical): No  Physical Activity: Not on file  Stress: Not on file  Social Connections: Moderately Integrated (01/17/2024)   Social Connection and Isolation Panel    Frequency of Communication with Friends and Family: Twice a week    Frequency of Social Gatherings with Friends and Family: Twice a week    Attends Religious Services: 1 to 4 times per year    Active Member  of [employer] or Organizations: No    Attends Banker Meetings: Never    Marital Status: Married  Catering manager Violence: Not At Risk (01/17/2024)   Humiliation, Afraid, Rape, and Kick questionnaire    Fear of Current or Ex-Partner: No    Emotionally Abused: No    Physically Abused: No    Sexually Abused: No    FAMILY HISTORY: Family History  Problem Relation Age of Onset   CAD Father     Review of Systems  Constitutional:  Positive for fatigue. Negative for appetite change, chills, fever and unexpected weight change.  HENT:   Negative for hearing loss, lump/mass and trouble swallowing.   Eyes:  Negative for eye problems and icterus.  Respiratory:  Negative for chest tightness, cough and shortness of breath.   Cardiovascular:  Negative for chest pain, leg swelling and palpitations.  Gastrointestinal:  Negative for abdominal distention, abdominal pain, constipation, diarrhea, nausea and vomiting.  Endocrine: Negative for hot flashes.  Genitourinary:  Negative for difficulty urinating.   Musculoskeletal:  Negative for arthralgias.  Skin:  Negative for itching and rash.  Neurological:  Negative for dizziness, extremity weakness, headaches and numbness.  Hematological:  Negative for adenopathy. Does not bruise/bleed easily.  Psychiatric/Behavioral:  Negative for depression. The patient is not nervous/anxious.       PHYSICAL EXAMINATION   Onc Performance Status - 05/22/24 1000       KPS SCALE   KPS % SCORE Able to carry on normal activity, minor s/s of disease          Vitals:   05/22/24 1025  BP: 135/63  Pulse: 77  Resp: 18  Temp: 98.3 F (36.8 C)  SpO2: 98%    Physical Exam Constitutional:      General: She is not in acute distress.    Appearance: Normal appearance. She is ill-appearing (appears chronically ill). She is not toxic-appearing.  HENT:     Head: Normocephalic and atraumatic.     Mouth/Throat:     Mouth: Mucous membranes are moist.      Pharynx: Oropharynx is clear. No oropharyngeal exudate or posterior oropharyngeal erythema.  Eyes:     General: No scleral icterus. Cardiovascular:     Rate and Rhythm: Normal rate and regular rhythm.     Pulses: Normal pulses.     Heart sounds: Normal heart sounds.  Pulmonary:     Effort: Pulmonary effort is normal.     Breath sounds: Normal breath sounds.  Abdominal:     General: Abdomen is flat. Bowel sounds are normal. There is no distension.     Palpations: Abdomen is soft.     Tenderness: There is no abdominal tenderness.  Musculoskeletal:        General: No swelling.     Cervical back: Neck supple.  Lymphadenopathy:     Cervical: No cervical adenopathy.  Skin:    General: Skin is warm and dry.  Findings: No rash.  Neurological:     General: No focal deficit present.     Mental Status: She is alert.  Psychiatric:        Mood and Affect: Mood normal.        Behavior: Behavior normal.     LABORATORY DATA:  CBC    Component Value Date/Time   WBC 5.6 05/22/2024 1000   WBC 9.2 05/01/2024 1236   RBC 5.60 (H) 05/22/2024 1000   HGB 15.7 (H) 05/22/2024 1000   HCT 46.6 (H) 05/22/2024 1000   PLT 379 05/22/2024 1000   MCV 83.2 05/22/2024 1000   MCH 28.0 05/22/2024 1000   MCHC 33.7 05/22/2024 1000   RDW 14.6 05/22/2024 1000   LYMPHSABS 1.3 05/22/2024 1000   MONOABS 0.5 05/22/2024 1000   EOSABS 0.1 05/22/2024 1000   BASOSABS 0.1 05/22/2024 1000    CMP     Component Value Date/Time   NA 138 05/22/2024 1000   NA 142 04/15/2019 1158   K 3.3 (L) 05/22/2024 1000   CL 101 05/22/2024 1000   CO2 31 05/22/2024 1000   GLUCOSE 150 (H) 05/22/2024 1000   BUN 17 05/22/2024 1000   BUN 17 04/15/2019 1158   CREATININE 1.00 05/22/2024 1000   CALCIUM 9.3 05/22/2024 1000   PROT 6.9 05/22/2024 1000   ALBUMIN 4.4 05/22/2024 1000   AST 22 05/22/2024 1000   ALT 17 05/22/2024 1000   ALKPHOS 88 05/22/2024 1000   BILITOT 1.4 (H) 05/22/2024 1000   GFRNONAA 58 (L) 05/22/2024 1000    GFRAA 67 04/15/2019 1158     ASSESSMENT and THERAPY PLAN:   Assessment and Plan Assessment & Plan Polycythemia vera with JAK2 mutation Managed with intermittent phlebotomy. Hemoglobin 15.7, slightly elevated, not requiring phlebotomy. Goal: hemoglobin <18. - Schedule follow-up in six months. - Advise to return sooner if hemoglobin levels increase or symptoms worsen.  Hypokalemia Potassium slightly decreased. On potassium supplementation, recently refilled. Low potassium may contribute to fatigue. - Continue potassium supplementation. - Send lab results to primary care provider for potential adjustment of potassium supplementation.  Elevated blood glucose Blood glucose slightly elevated. No immediate intervention required. - Send lab results to primary care provider for further management.  Elevated bilirubin Bilirubin slightly elevated, improved from previous levels. No urgent intervention required. - Send lab results to primary care provider for further management.      All questions were answered. The patient knows to call the clinic with any problems, questions or concerns. We can certainly see the patient much sooner if necessary.  Total encounter time:20 minutes*in face-to-face visit time, chart review, lab review, care coordination, order entry, and documentation of the encounter time.    Morna Kendall, NP 05/22/24 1:16 PM Medical Oncology and Hematology [facility] [address] [place], [place] [address] Tel. [phone]    Fax. [phone]  *Total Encounter Time as defined by the Centers for Medicare and Medicaid Services includes, in addition to the face-to-face time of a patient visit (documented in the note above) non-face-to-face time: obtaining and reviewing outside history, ordering and reviewing medications, tests or procedures, care coordination (communications with other health care professionals or caregivers) and documentation in the  medical record.

## 2024-06-12 ENCOUNTER — Encounter: Payer: Self-pay | Admitting: Podiatry

## 2024-06-12 ENCOUNTER — Ambulatory Visit (INDEPENDENT_AMBULATORY_CARE_PROVIDER_SITE_OTHER): Payer: Self-pay | Admitting: Podiatry

## 2024-06-12 DIAGNOSIS — L600 Ingrowing nail: Secondary | ICD-10-CM

## 2024-06-12 NOTE — Progress Notes (Signed)
 Patient presents follow-up nail surgery .  No complaints.  Physical exam:  Dermatologic: Nail surgery site for matrixectomy healing well with no signs of infection.  Diagnosis: 1.  Status post matrixectomy toe.  Healing well  Plan: - POV status post nail surgery if patient has any problems she can call for appointment otherwise we can see her as needed - Return as needed

## 2024-06-15 ENCOUNTER — Emergency Department (HOSPITAL_COMMUNITY)
Admission: EM | Admit: 2024-06-15 | Discharge: 2024-06-15 | Disposition: A | Discharge status: Home / Self Care | Attending: Emergency Medicine | Admitting: Emergency Medicine

## 2024-06-15 ENCOUNTER — Other Ambulatory Visit: Payer: Self-pay

## 2024-06-15 ENCOUNTER — Encounter (HOSPITAL_COMMUNITY): Payer: Self-pay

## 2024-06-15 DIAGNOSIS — I10 Essential (primary) hypertension: Secondary | ICD-10-CM | POA: Diagnosis not present

## 2024-06-15 DIAGNOSIS — M7989 Other specified soft tissue disorders: Secondary | ICD-10-CM | POA: Diagnosis present

## 2024-06-15 DIAGNOSIS — Z79899 Other long term (current) drug therapy: Secondary | ICD-10-CM | POA: Insufficient documentation

## 2024-06-15 DIAGNOSIS — L03113 Cellulitis of right upper limb: Secondary | ICD-10-CM | POA: Insufficient documentation

## 2024-06-15 DIAGNOSIS — E039 Hypothyroidism, unspecified: Secondary | ICD-10-CM | POA: Diagnosis not present

## 2024-06-15 DIAGNOSIS — Z7982 Long term (current) use of aspirin: Secondary | ICD-10-CM | POA: Diagnosis not present

## 2024-06-15 DIAGNOSIS — Z79890 Hormone replacement therapy (postmenopausal): Secondary | ICD-10-CM | POA: Diagnosis not present

## 2024-06-15 DIAGNOSIS — Z853 Personal history of malignant neoplasm of breast: Secondary | ICD-10-CM | POA: Insufficient documentation

## 2024-06-15 LAB — CBC WITH DIFFERENTIAL/PLATELET
Abs Immature Granulocytes: 0.03 K/uL (ref 0.00–0.07)
Basophils Absolute: 0.1 K/uL (ref 0.0–0.1)
Basophils Relative: 1 %
Eosinophils Absolute: 0.2 K/uL (ref 0.0–0.5)
Eosinophils Relative: 2 %
HCT: 50.4 % — ABNORMAL HIGH (ref 36.0–46.0)
Hemoglobin: 16.3 g/dL — ABNORMAL HIGH (ref 12.0–15.0)
Immature Granulocytes: 0 %
Lymphocytes Relative: 22 %
Lymphs Abs: 1.9 K/uL (ref 0.7–4.0)
MCH: 27.9 pg (ref 26.0–34.0)
MCHC: 32.3 g/dL (ref 30.0–36.0)
MCV: 86.2 fL (ref 80.0–100.0)
Monocytes Absolute: 0.9 K/uL (ref 0.1–1.0)
Monocytes Relative: 10 %
Neutro Abs: 5.5 K/uL (ref 1.7–7.7)
Neutrophils Relative %: 65 %
Platelets: 444 K/uL — ABNORMAL HIGH (ref 150–400)
RBC: 5.85 MIL/uL — ABNORMAL HIGH (ref 3.87–5.11)
RDW: 14.5 % (ref 11.5–15.5)
WBC: 8.6 K/uL (ref 4.0–10.5)
nRBC: 0 % (ref 0.0–0.2)

## 2024-06-15 LAB — COMPREHENSIVE METABOLIC PANEL WITH GFR
ALT: 20 U/L (ref 0–44)
AST: 39 U/L (ref 15–41)
Albumin: 4.2 g/dL (ref 3.5–5.0)
Alkaline Phosphatase: 78 U/L (ref 38–126)
Anion gap: 13 (ref 5–15)
BUN: 12 mg/dL (ref 8–23)
CO2: 24 mmol/L (ref 22–32)
Calcium: 9.1 mg/dL (ref 8.9–10.3)
Chloride: 103 mmol/L (ref 98–111)
Creatinine, Ser: 1.02 mg/dL — ABNORMAL HIGH (ref 0.44–1.00)
GFR, Estimated: 56 mL/min — ABNORMAL LOW (ref >60)
Glucose, Bld: 87 mg/dL (ref 70–99)
Potassium: 3.8 mmol/L (ref 3.5–5.1)
Sodium: 140 mmol/L (ref 135–145)
Total Bilirubin: 2 mg/dL — ABNORMAL HIGH (ref 0.0–1.2)
Total Protein: 7 g/dL (ref 6.5–8.1)

## 2024-06-15 LAB — URINALYSIS, W/ REFLEX TO CULTURE (INFECTION SUSPECTED)
Bacteria, UA: NONE SEEN
Bilirubin Urine: NEGATIVE
Glucose, UA: NEGATIVE mg/dL
Hgb urine dipstick: NEGATIVE
Ketones, ur: NEGATIVE mg/dL
Nitrite: NEGATIVE
Protein, ur: NEGATIVE mg/dL
Specific Gravity, Urine: 1.017 (ref 1.005–1.030)
pH: 5 (ref 5.0–8.0)

## 2024-06-15 LAB — I-STAT CG4 LACTIC ACID, ED: Lactic Acid, Venous: 1.5 mmol/L (ref 0.5–1.9)

## 2024-06-15 MED ORDER — DOXYCYCLINE HYCLATE 100 MG PO CAPS: 100.0000 mg | ORAL_CAPSULE | Freq: Two times a day (BID) | ORAL | 0 refills | Status: AC

## 2024-06-15 MED ORDER — CEPHALEXIN 250 MG PO CAPS
500.0000 mg | ORAL_CAPSULE | Freq: Once | ORAL | Status: AC
  Administered 2024-06-15: 500 mg via ORAL
  Filled 2024-06-15: qty 2

## 2024-06-15 MED ORDER — DOXYCYCLINE HYCLATE 100 MG PO TABS
100.0000 mg | ORAL_TABLET | Freq: Once | ORAL | Status: AC
  Administered 2024-06-15: 100 mg via ORAL
  Filled 2024-06-15: qty 1

## 2024-06-15 MED ORDER — CEPHALEXIN 500 MG PO CAPS: 500.0000 mg | ORAL_CAPSULE | Freq: Four times a day (QID) | ORAL | 0 refills | Status: AC

## 2024-06-15 NOTE — ED Triage Notes (Signed)
 Pt presents with redness and swelling to right arm that she noticed over the last couple of week. She is not sure really when she noticed it. She reports similar swelling last year and was told it was cellulitis. + radial pulse, denies fevers/chills.

## 2024-06-15 NOTE — ED Provider Notes (Signed)
 [facility] EMERGENCY DEPARTMENT AT [facility] Provider Note   CSN: [id number] Arrival date & time: 06/15/24  1626     Patient presents with: Arm Swelling   Donna Kirk is a 78 y.o. female.   The history is provided by the patient and medical records.   78 year old female with history of polycythemia vera, hypothyroidism, hypertension, history of breast cancer, presenting to the ED for right arm erythema.  She had an appointment with PCP today for B12 injection and NP looked at her arm and encouraged her to come to the ER.  She was admitted in May for cellulitis of the same arm.  Redness had resolved but has had persistent swelling since that time.  While hospitalized she did have MRI of the arm as well as duplex without findings of DVT or bony infection.  She denies any fever or chills.  She is not entirely sure when the redness began this time.  States it is not quite as bad as last time.  She has not recently been on any kind of antibiotic.  Prior to Admission medications   Medication Sig Start Date End Date Taking? Authorizing Provider  aspirin  EC 81 MG tablet Take 81 mg by mouth daily. Swallow whole.    [provider]  CALCIUM PO Take 1 tablet by mouth daily.    [provider]  chlorthalidone  (HYGROTON ) 25 MG tablet TAKE 1 TABLET(25 MG) BY MOUTH DAILY 02/25/24   Lavona Agent, MD  Cholecalciferol  (VITAMIN D -3 PO) Take 1 capsule by mouth daily.    [provider]  levothyroxine  (SYNTHROID ) 100 MCG tablet Take 100 mcg by mouth daily before breakfast. 09/13/23   [provider]  Potassium Chloride  ER 20 MEQ TBCR Take 1 tablet (20 mEq total) by mouth 2 (two) times daily. 01/19/24   Raenelle Coria, MD  simvastatin  (ZOCOR ) 20 MG tablet Take 20 mg by mouth daily. 06/05/15   [provider]    Allergies: Patient has no known allergies.    Review of Systems  Skin:  Positive for color change.  All other systems reviewed and are  negative.   Updated Vital Signs BP (!) 150/60   Pulse 77   Temp (!) 97.5 F (36.4 C) (Oral)   Resp 20   Ht 5' 3 (1.6 m)   Wt 78 kg   SpO2 99%   BMI 30.47 kg/m   Physical Exam Vitals and nursing note reviewed.  Constitutional:      Appearance: She is well-developed.  HENT:     Head: Normocephalic and atraumatic.  Eyes:     Conjunctiva/sclera: Conjunctivae normal.     Pupils: Pupils are equal, round, and reactive to light.  Cardiovascular:     Rate and Rhythm: Normal rate and regular rhythm.     Heart sounds: Normal heart sounds.  Pulmonary:     Effort: Pulmonary effort is normal.     Breath sounds: Normal breath sounds.  Abdominal:     General: Bowel sounds are normal.     Palpations: Abdomen is soft.  Musculoskeletal:        General: Normal range of motion.     Cervical back: Normal range of motion.     Comments: Very faint appearing erythema of volar right forearm and somewhat into the inner upper arm, there is some mild warmth to touch without any overt induration, able to flex and extend at the elbow without apparent pain or difficulty, does not seem to have  any gross asymmetry compared with left arm, radial pulse intact, normal grip  Skin:    General: Skin is warm and dry.  Neurological:     Mental Status: She is alert and oriented to person, place, and time.     (all labs ordered are listed, but only abnormal results are displayed) Labs Reviewed  COMPREHENSIVE METABOLIC PANEL WITH GFR - Abnormal; Notable for the following components:      Result Value   Creatinine, Ser 1.02 (*)    Total Bilirubin 2.0 (*)    GFR, Estimated 56 (*)    All other components within normal limits  CBC WITH DIFFERENTIAL/PLATELET - Abnormal; Notable for the following components:   RBC 5.85 (*)    Hemoglobin 16.3 (*)    HCT 50.4 (*)    Platelets 444 (*)    All other components within normal limits  URINALYSIS, W/ REFLEX TO CULTURE (INFECTION SUSPECTED) - Abnormal; Notable for the  following components:   Leukocytes,Ua MODERATE (*)    All other components within normal limits  URINE CULTURE  I-STAT CG4 LACTIC ACID, ED  I-STAT CG4 LACTIC ACID, ED    EKG: None  Radiology: No results found.   Procedures   Medications Ordered in the ED  cephALEXin  (KEFLEX ) capsule 500 mg (has no administration in time range)  doxycycline  (VIBRA -TABS) tablet 100 mg (has no administration in time range)                                    Medical Decision Making Amount and/or Complexity of Data Reviewed Labs: ordered. ECG/medicine tests: ordered and independent interpretation performed.  Risk Prescription drug management.   78 year old female presenting to the ED with right arm erythema.  History of cellulitis earlier this year to same arm.  She is afebrile and nontoxic in appearance here.  She does have mild erythema to the volar right forearm and somewhat into the inner upper arm.  There is no overt induration.  I do not appreciate any significant asymmetry compared with her left arm.  Able to range arm fully.  Neurovascularly intact.  This does not seem consistent with septic joint.  Labs here with normal lactate, no leukocytosis.  No significant electrolyte derangement.  Patient has not recently been on antibiotics.  It appears during her last admission she had failed outpatient management and ultimately acquired sepsis.  Discussed with patient and son at bedside options of inpatient versus outpatient admission.  She is strongly desiring to go home.  She admits that her symptoms do not seem quite as severe as last time and she would like to try oral antibiotics.  I think this is reasonable.  It appears she did not do well with Bactrim previously but had good response to doxycycline  and Keflex  combo so we will restart this, first dose given here.  Advised to use probiotic with this to avoid GI upset.  She was instructed to monitor her arm closely, follow-up closely with PCP  but will return here for any new/acute changes.  Final diagnoses:  Cellulitis of right upper extremity    ED Discharge Orders          Ordered    doxycycline  (VIBRAMYCIN ) 100 MG capsule  2 times daily        06/15/24 2259    cephALEXin  (KEFLEX ) 500 MG capsule  4 times daily        06/15/24 2259  Jarold Olam HERO, PA-C 06/16/24 VALLERY    Tonia Chew, MD 06/22/24 1356

## 2024-06-15 NOTE — ED Provider Triage Note (Signed)
 Emergency Medicine Provider Triage Evaluation Note  Donna Kirk , a 78 y.o. female  was evaluated in triage.  Pt complains of right-sided forearm erythema and edema.  Has had previous episode with admission for cellulitis.  Discharged in May 2025 for same.  No previous medical history of of hypertension, hypertrophic cardiomyopathy  Review of Systems  Positive: As above Negative:   Physical Exam  BP (!) 150/85 (BP Location: Left Arm)   Pulse 99   Temp 97.6 F (36.4 C) (Oral)   Resp 18   Ht 5' 3 (1.6 m)   Wt 78 kg   SpO2 94%   BMI 30.47 kg/m  Gen:   Awake, no distress   Resp:  Normal effort  MSK:   Moves extremities without difficulty  Other:  Noted erythema and edema to the anterior surface of the right forearm.  No tenderness to the same.  Medical Decision Making  Medically screening exam initiated at 7:15 PM.  Appropriate orders placed.  ERILYN PEARMAN was informed that the remainder of the evaluation will be completed by another provider, this initial triage assessment does not replace that evaluation, and the importance of remaining in the ED until their evaluation is complete.  Initial labs ordered.   Myriam Dorn BROCKS, [place] 06/15/24 [place]

## 2024-06-15 NOTE — Discharge Instructions (Addendum)
 Take the prescribed medication as directed.  May want to take probiotic with these to avoid GI upset. Follow-up with your primary care doctor. Monitor your arm closely for any worsening swelling, redness, developing fever, etc. Return to the ED for new or worsening symptoms.

## 2024-06-18 LAB — URINE CULTURE: Culture: 30000 — AB

## 2024-08-14 ENCOUNTER — Ambulatory Visit: Admitting: Podiatry

## 2024-09-11 ENCOUNTER — Encounter (INDEPENDENT_AMBULATORY_CARE_PROVIDER_SITE_OTHER): Payer: Medicare Other | Admitting: Ophthalmology

## 2024-09-11 DIAGNOSIS — H33301 Unspecified retinal break, right eye: Secondary | ICD-10-CM

## 2024-09-11 DIAGNOSIS — H35033 Hypertensive retinopathy, bilateral: Secondary | ICD-10-CM

## 2024-09-11 DIAGNOSIS — D3131 Benign neoplasm of right choroid: Secondary | ICD-10-CM | POA: Diagnosis not present

## 2024-09-11 DIAGNOSIS — I10 Essential (primary) hypertension: Secondary | ICD-10-CM | POA: Diagnosis not present

## 2024-09-11 DIAGNOSIS — H43813 Vitreous degeneration, bilateral: Secondary | ICD-10-CM

## 2024-11-20 ENCOUNTER — Other Ambulatory Visit

## 2024-11-20 ENCOUNTER — Ambulatory Visit: Admitting: Adult Health

## 2025-09-17 ENCOUNTER — Encounter (INDEPENDENT_AMBULATORY_CARE_PROVIDER_SITE_OTHER): Admitting: Ophthalmology
